# Patient Record
Sex: Male | Born: 1960 | Race: Black or African American | Hispanic: No | Marital: Married | Smoking: Current every day smoker
Health system: Southern US, Community
[De-identification: ages and names within clinical notes are randomized; demographics above are authoritative.]

## PROBLEM LIST (undated history)

## (undated) ENCOUNTER — Emergency Department (HOSPITAL_COMMUNITY): Discharge status: Absent without leave | Payer: Self-pay

## (undated) DIAGNOSIS — E119 Type 2 diabetes mellitus without complications: Secondary | ICD-10-CM

## (undated) DIAGNOSIS — E118 Type 2 diabetes mellitus with unspecified complications: Secondary | ICD-10-CM

## (undated) DIAGNOSIS — F17200 Nicotine dependence, unspecified, uncomplicated: Secondary | ICD-10-CM

## (undated) DIAGNOSIS — E785 Hyperlipidemia, unspecified: Secondary | ICD-10-CM

## (undated) HISTORY — DX: Nicotine dependence, unspecified, uncomplicated: F17.200

## (undated) HISTORY — DX: Type 2 diabetes mellitus with unspecified complications: E11.8

## (undated) HISTORY — DX: Hyperlipidemia, unspecified: E78.5

---

## 1998-05-16 ENCOUNTER — Emergency Department (HOSPITAL_COMMUNITY): Admission: EM | Admit: 1998-05-16 | Discharge: 1998-05-16 | Payer: Self-pay | Admitting: Internal Medicine

## 1999-12-21 ENCOUNTER — Emergency Department (HOSPITAL_COMMUNITY): Admission: EM | Admit: 1999-12-21 | Discharge: 1999-12-21 | Payer: Self-pay | Admitting: Emergency Medicine

## 2003-02-15 ENCOUNTER — Emergency Department (HOSPITAL_COMMUNITY): Admission: EM | Admit: 2003-02-15 | Discharge: 2003-02-15 | Payer: Self-pay | Admitting: *Deleted

## 2003-02-15 ENCOUNTER — Encounter: Payer: Self-pay | Admitting: *Deleted

## 2004-07-01 ENCOUNTER — Emergency Department (HOSPITAL_COMMUNITY): Admission: EM | Admit: 2004-07-01 | Discharge: 2004-07-01 | Payer: Self-pay | Admitting: Emergency Medicine

## 2007-07-14 ENCOUNTER — Emergency Department (HOSPITAL_COMMUNITY): Admission: EM | Admit: 2007-07-14 | Discharge: 2007-07-14 | Payer: Self-pay | Admitting: Emergency Medicine

## 2011-08-29 ENCOUNTER — Emergency Department (HOSPITAL_COMMUNITY): Payer: Self-pay

## 2011-08-29 ENCOUNTER — Emergency Department (HOSPITAL_COMMUNITY)
Admission: EM | Admit: 2011-08-29 | Discharge: 2011-08-29 | Disposition: A | Discharge status: Home / Self Care | Payer: Self-pay | Attending: Emergency Medicine | Admitting: Emergency Medicine

## 2011-08-29 DIAGNOSIS — R071 Chest pain on breathing: Secondary | ICD-10-CM | POA: Insufficient documentation

## 2011-08-29 DIAGNOSIS — F341 Dysthymic disorder: Secondary | ICD-10-CM | POA: Insufficient documentation

## 2011-08-29 LAB — COMPREHENSIVE METABOLIC PANEL
ALT: 23 U/L (ref 0–53)
Alkaline Phosphatase: 70 U/L (ref 39–117)
BUN: 15 mg/dL (ref 6–23)
CO2: 24 mEq/L (ref 19–32)
Calcium: 10.4 mg/dL (ref 8.4–10.5)
GFR calc Af Amer: >90 mL/min (ref >90)
GFR calc non Af Amer: >90 mL/min (ref >90)
Glucose, Bld: 192 mg/dL — ABNORMAL HIGH (ref 70–99)
Sodium: 137 mEq/L (ref 135–145)

## 2011-08-29 LAB — DIFFERENTIAL
Basophils Absolute: 0.1 10*3/uL (ref 0.0–0.1)
Lymphocytes Relative: 23 % (ref 12–46)
Lymphs Abs: 2.5 10*3/uL (ref 0.7–4.0)
Monocytes Absolute: 0.8 10*3/uL (ref 0.1–1.0)
Neutro Abs: 7.2 10*3/uL (ref 1.7–7.7)

## 2011-08-29 LAB — CBC
HCT: 40.9 % (ref 39.0–52.0)
Hemoglobin: 14.2 g/dL (ref 13.0–17.0)
MCH: 31.7 pg (ref 26.0–34.0)
RBC: 4.48 MIL/uL (ref 4.22–5.81)

## 2011-08-29 LAB — TROPONIN I: Troponin I: <0.3 ng/mL (ref <0.30)

## 2011-09-28 ENCOUNTER — Emergency Department (HOSPITAL_COMMUNITY)
Admission: EM | Admit: 2011-09-28 | Discharge: 2011-09-28 | Disposition: A | Discharge status: Home / Self Care | Payer: Self-pay | Attending: Emergency Medicine | Admitting: Emergency Medicine

## 2011-09-28 DIAGNOSIS — F17200 Nicotine dependence, unspecified, uncomplicated: Secondary | ICD-10-CM | POA: Insufficient documentation

## 2011-09-28 DIAGNOSIS — L732 Hidradenitis suppurativa: Secondary | ICD-10-CM | POA: Insufficient documentation

## 2011-09-28 DIAGNOSIS — IMO0002 Reserved for concepts with insufficient information to code with codable children: Secondary | ICD-10-CM | POA: Insufficient documentation

## 2011-09-28 MED ORDER — LIDOCAINE-EPINEPHRINE 2 %-1:100000 IJ SOLN
20.0000 mL | Freq: Once | INTRAMUSCULAR | Status: AC
  Administered 2011-09-28: 20 mL
  Filled 2011-09-28: qty 20

## 2011-09-28 NOTE — ED Provider Notes (Cosign Needed)
History     CSN: [id number] Arrival date & time: 09/28/2011  2:02 PM   First MD Initiated Contact with Patient 09/28/11 1902      Chief Complaint  Patient presents with  . Abscess    lt. armpit    (Consider location/radiation/quality/duration/timing/severity/associated sxs/prior treatment) The history is provided by the patient.   the patient is a 50 year old male, who complains of painful bumps under his left arm.  He denies trauma.  He denied nausea, vomiting, fevers, chills.  He has never had this before.  He thinks that they drained last night.  History reviewed. No pertinent past medical history.  History reviewed. No pertinent past surgical history.  History reviewed. No pertinent family history.  History  Substance Use Topics  . Smoking status: Current Everyday Smoker    Types: Cigarettes  . Smokeless tobacco: Never Used  . Alcohol Use: Yes      Review of Systems  Constitutional: Negative for fever and chills.  Gastrointestinal: Negative for nausea and vomiting.  Musculoskeletal:       Painful bumps in his left axilla  Skin: Negative for rash.  Neurological: Negative for headaches.  Psychiatric/Behavioral: Negative for confusion.    Allergies  Review of patient's allergies indicates no known allergies.  Home Medications  No current outpatient prescriptions on file.  BP 103/68  Pulse 81  Temp(Src) 97.7 F (36.5 C) (Oral)  Resp 16  SpO2 99%  Physical Exam  Constitutional: He is oriented to person, place, and time. He appears well-developed and well-nourished.  HENT:  Head: Normocephalic and atraumatic.  Eyes: Pupils are equal, round, and reactive to light.  Neck: Normal range of motion.  Abdominal: He exhibits no distension.  Musculoskeletal:       3 cm abscess and 2 cm abscess in left axilla, with moderate amount of tenderness.  Neurological: He is alert and oriented to person, place, and time.  Skin: Skin is warm and dry. No rash noted.    Psychiatric: He has a normal mood and affect.    ED Course  INCISION AND DRAINAGE Date/Time: 09/28/2011 7:48 PM Performed by: Weldon Inches, Kevork Joyce P Authorized by: Weldon Inches, Fatoumata Albaugh P Consent: Verbal consent obtained. Consent given by: patient Patient understanding: patient states understanding of the procedure being performed Type: abscess Body area: upper extremity (Left axilla) Anesthesia: local infiltration Local anesthetic: lidocaine 2% with epinephrine Anesthetic total: 3 ml Patient sedated: no Scalpel size: 11 Needle gauge: 20 Incision type: single straight Complexity: simple Drainage: purulent Drainage amount: moderate Packing material: 1/4 in iodoform gauze Patient tolerance: Patient tolerated the procedure well with no immediate complications.   (including critical care time)   Incision and drainage of 2 abscesses in the left axilla  Labs Reviewed - No data to display No results found.   No diagnosis found.    MDM  Hidradenitis        Nicholes Stairs, MD 09/28/11 1950

## 2011-09-28 NOTE — ED Notes (Signed)
3 abcesses under lt. Arm pit, drained  Slightly yesterday, very painful

## 2011-09-28 NOTE — ED Notes (Signed)
No answer when called to come to room from lobby.

## 2011-11-08 ENCOUNTER — Emergency Department (HOSPITAL_COMMUNITY)
Admission: EM | Admit: 2011-11-08 | Discharge: 2011-11-09 | Disposition: A | Discharge status: Home / Self Care | Payer: Self-pay | Attending: Emergency Medicine | Admitting: Emergency Medicine

## 2011-11-08 ENCOUNTER — Emergency Department (HOSPITAL_COMMUNITY): Payer: Self-pay

## 2011-11-08 ENCOUNTER — Encounter (HOSPITAL_COMMUNITY): Payer: Self-pay | Admitting: Adult Health

## 2011-11-08 DIAGNOSIS — F17200 Nicotine dependence, unspecified, uncomplicated: Secondary | ICD-10-CM | POA: Insufficient documentation

## 2011-11-08 DIAGNOSIS — IMO0001 Reserved for inherently not codable concepts without codable children: Secondary | ICD-10-CM | POA: Insufficient documentation

## 2011-11-08 DIAGNOSIS — J189 Pneumonia, unspecified organism: Secondary | ICD-10-CM | POA: Insufficient documentation

## 2011-11-08 MED ORDER — ALBUTEROL SULFATE HFA 108 (90 BASE) MCG/ACT IN AERS
2.0000 | INHALATION_SPRAY | RESPIRATORY_TRACT | Status: DC | PRN
  Administered 2011-11-09: 2 via RESPIRATORY_TRACT
  Filled 2011-11-08: qty 6.7

## 2011-11-08 NOTE — ED Notes (Signed)
Reports cough, chills, fever and loss of voice since Tuesday coughis productive yellow sputum.

## 2011-11-08 NOTE — ED Provider Notes (Signed)
History     CSN: [id number]  Arrival date & time 11/08/11  1719   First MD Initiated Contact with Patient 11/08/11 2124      Chief Complaint  Patient presents with  . Influenza    (Consider location/radiation/quality/duration/timing/severity/associated sxs/prior treatment) Patient is a 51 y.o. male presenting with flu symptoms.  Influenza This is a new problem. The current episode started in the past 7 days. The problem occurs constantly. The problem has been unchanged. Associated symptoms include chills, congestion, coughing, fatigue, a fever, headaches, myalgias and a sore throat. Pertinent negatives include no abdominal pain, change in bowel habit, chest pain, joint swelling, nausea, neck pain, numbness, rash, swollen glands, urinary symptoms, vertigo, visual change, vomiting or weakness. The symptoms are aggravated by nothing. Treatments tried: otc meds. The treatment provided mild relief.    History reviewed. No pertinent past medical history.  History reviewed. No pertinent past surgical history.  History reviewed. No pertinent family history.  History  Substance Use Topics  . Smoking status: Current Everyday Smoker    Types: Cigarettes  . Smokeless tobacco: Never Used  . Alcohol Use: Yes      Review of Systems  Constitutional: Positive for fever, chills and fatigue.  HENT: Positive for congestion, sore throat and sinus pressure. Negative for hearing loss, ear pain, drooling, trouble swallowing, neck pain, neck stiffness, voice change, tinnitus and ear discharge.   Eyes: Negative for pain and visual disturbance.  Respiratory: Positive for cough. Negative for chest tightness and shortness of breath.   Cardiovascular: Negative for chest pain.  Gastrointestinal: Negative for nausea, vomiting, abdominal pain and change in bowel habit.  Genitourinary: Negative for dysuria, hematuria and flank pain.  Musculoskeletal: Positive for myalgias. Negative for back pain, joint  swelling and gait problem.  Skin: Negative for rash.  Neurological: Positive for headaches. Negative for dizziness, vertigo, syncope, weakness and numbness.  Psychiatric/Behavioral: Negative for confusion.    Allergies  Review of patient's allergies indicates no known allergies.  Home Medications   Current Outpatient Rx  Name Route Sig Dispense Refill  . IBUPROFEN 200 MG PO TABS Oral Take 400 mg by mouth every 6 (six) hours as needed. headache    . PHENYLEPHRINE-PHENIRAMINE-DM 08-15-19 MG PO PACK Oral Take 1 packet by mouth 2 (two) times daily as needed.      BP 111/71  Pulse 90  Temp 98.9 F (37.2 C)  Resp 20  SpO2 95%  Physical Exam  Nursing note and vitals reviewed. Constitutional: He is oriented to person, place, and time. He appears well-developed and well-nourished.       Uncomfortable appearing  HENT:  Head: Normocephalic and atraumatic.  Right Ear: External ear normal.  Left Ear: External ear normal.  Nose: Nose normal.  Mouth/Throat: Oropharyngeal exudate present.       Bilateral TM with mild erythema.   Eyes: Conjunctivae are normal. Pupils are equal, round, and reactive to light. Right eye exhibits no discharge. Left eye exhibits no discharge.  Neck: Normal range of motion. Neck supple.  Cardiovascular: Normal rate and regular rhythm.   No murmur heard. Pulmonary/Chest: Effort normal and breath sounds normal. No respiratory distress. He has no wheezes. He exhibits no tenderness.  Abdominal: Soft. He exhibits no distension. There is no tenderness.  Musculoskeletal: He exhibits no edema and no tenderness.  Lymphadenopathy:    He has no cervical adenopathy.  Neurological: He is alert and oriented to person, place, and time. No cranial nerve deficit.  Skin: Skin is  warm and dry. No rash noted.    ED Course  Procedures (including critical care time)   Labs Reviewed  RAPID STREP SCREEN   Dg Chest 2 View  11/08/2011  *RADIOLOGY REPORT*  Clinical Data: Mild  hypoxia; cough and shortness of breath. Congestion and fever.  History of smoking.  CHEST - 2 VIEW  Comparison: Chest radiograph performed 08/29/2011  Findings: The lungs are well-aerated.  Patchy bilateral midlung airspace opacity is noted, worse on the left, concerning for multifocal pneumonia.  There is no evidence of pleural effusion or pneumothorax.  The heart is normal in size; the mediastinal contour is within normal limits.  No acute osseous abnormalities are seen.  IMPRESSION: Bilateral midlung airspace disease, worse on the left, concerning for multifocal pneumonia.  Original Report Authenticated By: Tonia Ghent, M.D.     Dx 1: CAP   MDM  Oropharyngeal exudate swabbed for strep. CXR ordered to r/o pneumonia given mild hypoxia at 95% on RA      12:00 AM Pneumonia on CXR. Given multifocal description of airspace disease, will tx with avelox. Also ordered albuterol MDI for cough and airway constriction.  [address] [place], [place] 11/09/11 [phone]

## 2011-11-09 MED ORDER — MOXIFLOXACIN HCL 400 MG PO TABS: 400.0000 mg | ORAL_TABLET | Freq: Every day | ORAL | Status: AC

## 2011-11-09 NOTE — ED Notes (Signed)
D/C completed by A.Dennis,RN 

## 2011-11-09 NOTE — ED Provider Notes (Signed)
Medical screening examination/treatment/procedure(s) were performed by non-physician practitioner and as supervising physician I was immediately available for consultation/collaboration.   Erikka Follmer M Krizia Flight, DO 11/09/11 1259 

## 2012-02-08 IMAGING — CR DG CHEST 2V
2 series · 2 of 2 positions shown · non-contrast
Comparison: Chest radiograph performed 08/29/2011

CLINICAL DATA: Mild hypoxia; cough and shortness of breath.
Congestion and fever.  History of smoking.

CHEST - 2 VIEW

[w chest pa]
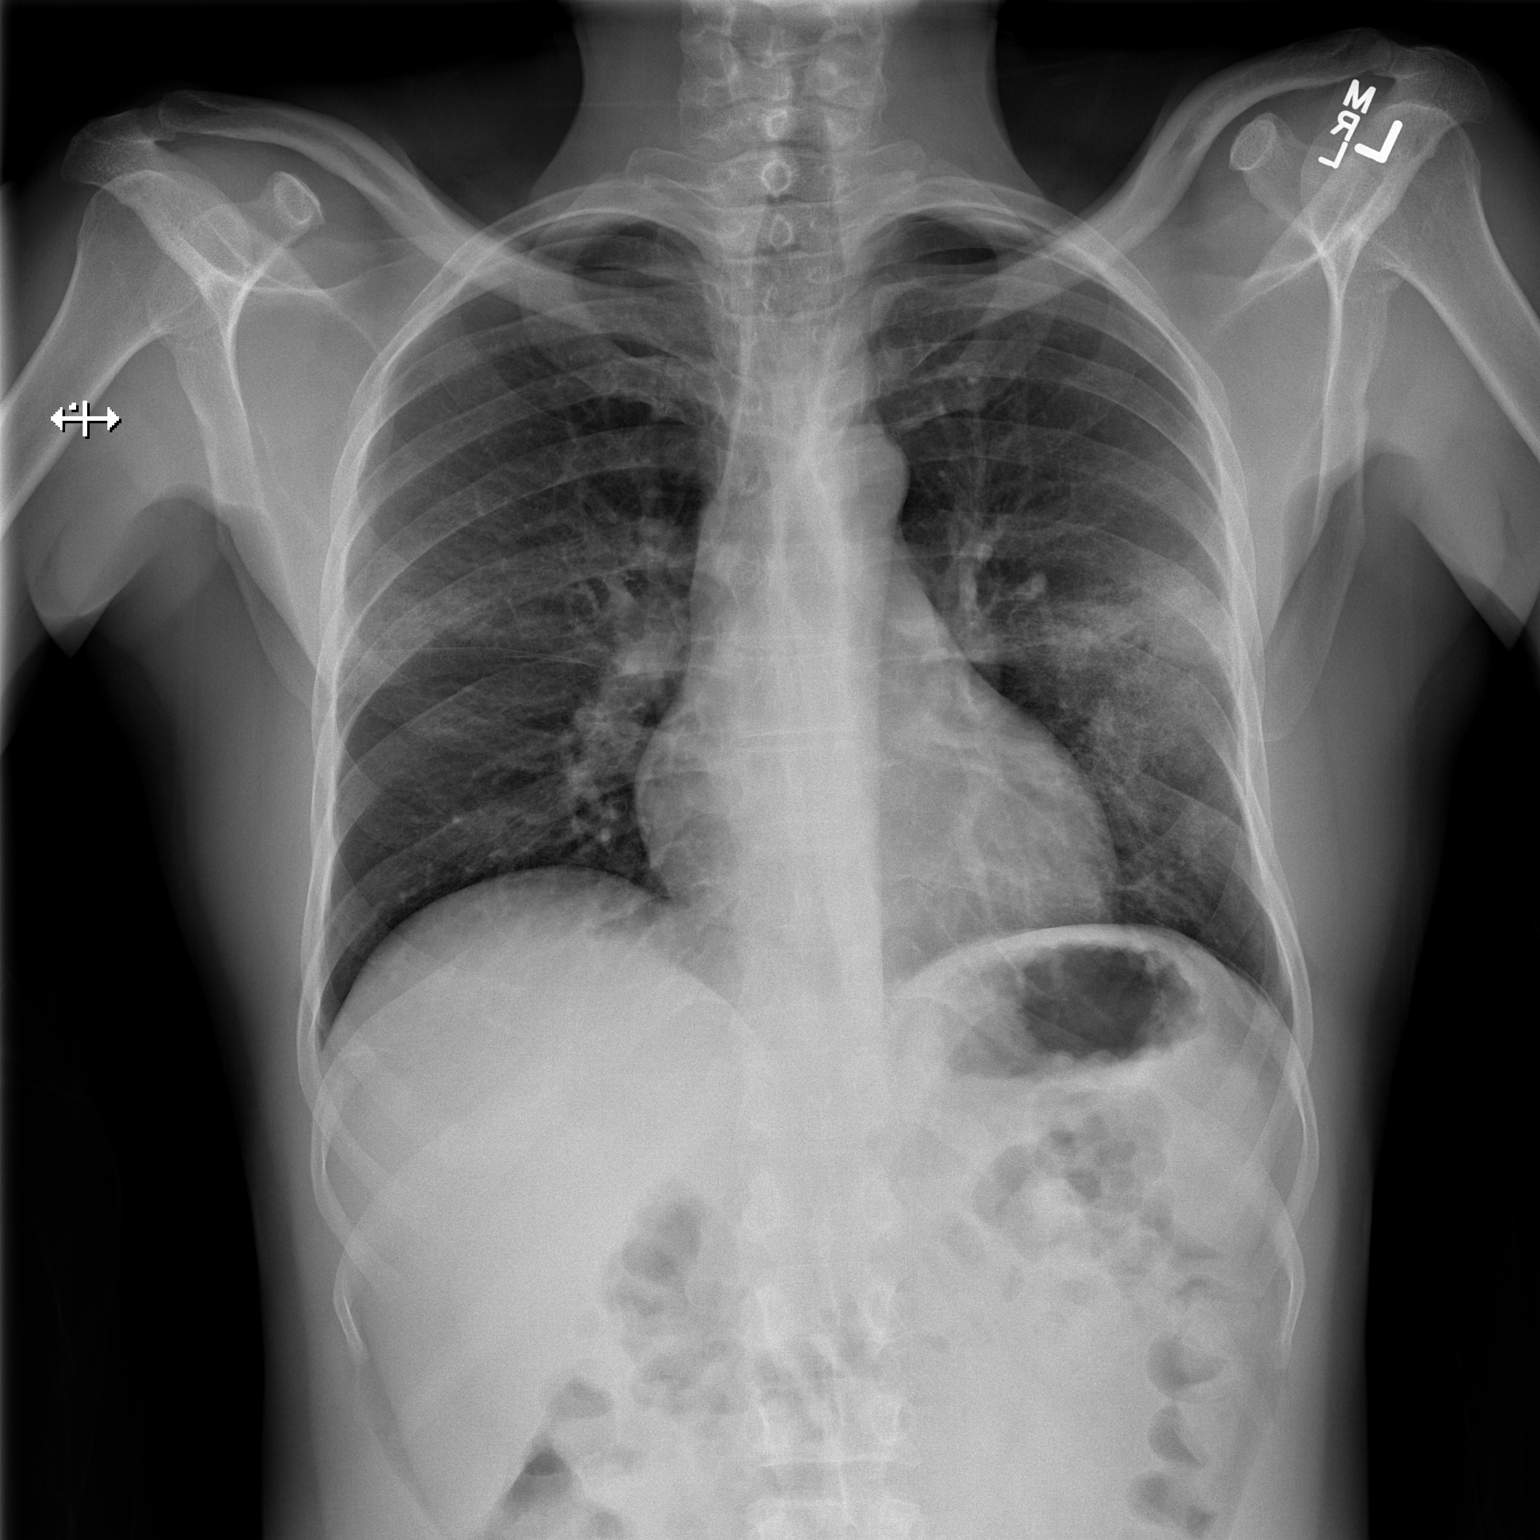

[w chest lat]
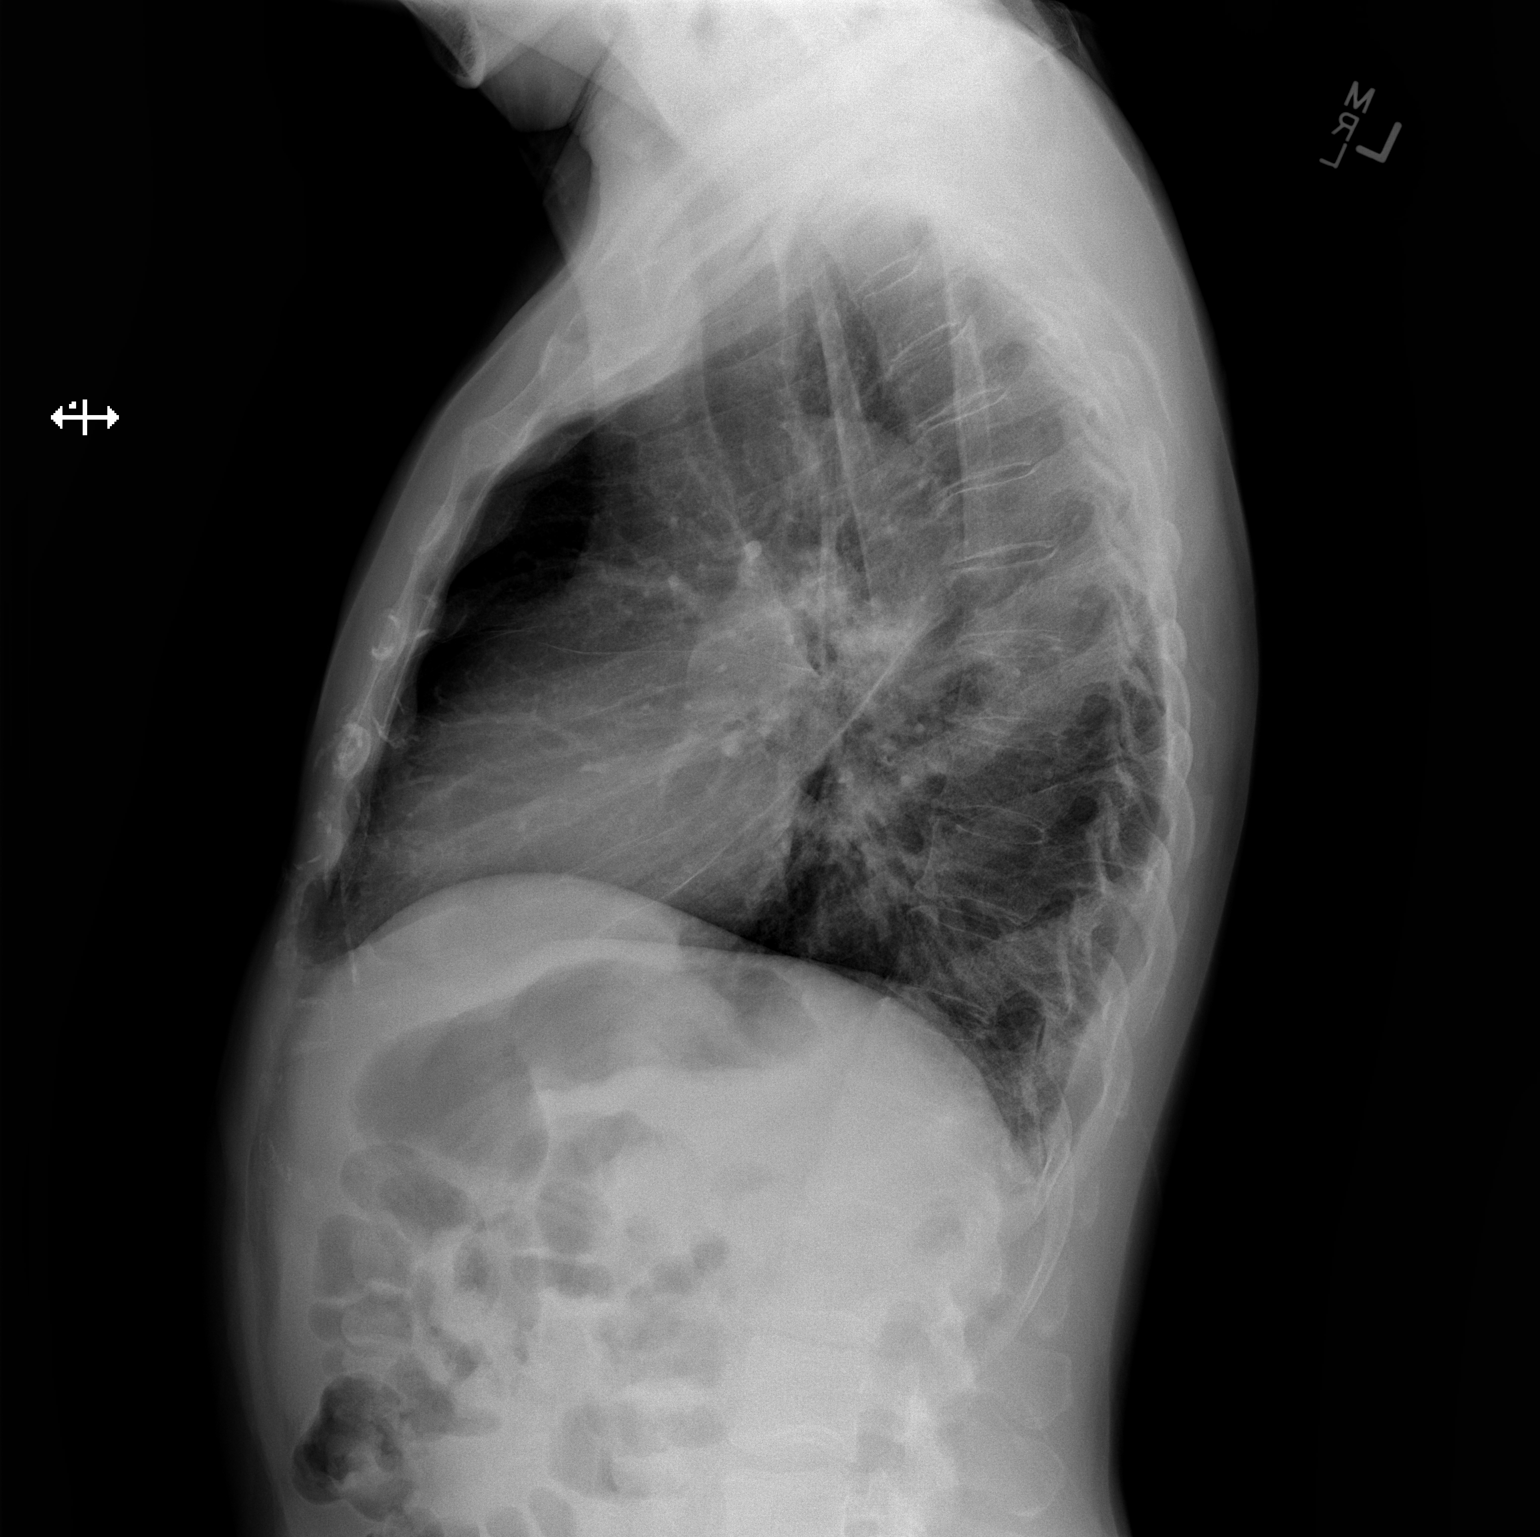

[2 of 2 positions shown; findings below may reference images not displayed]

FINDINGS: The lungs are well-aerated.  Patchy bilateral midlung
airspace opacity is noted, worse on the left, concerning for
multifocal pneumonia.  There is no evidence of pleural effusion or
pneumothorax.

The heart is normal in size; the mediastinal contour is within
normal limits.  No acute osseous abnormalities are seen.
IMPRESSION: Bilateral midlung airspace disease, worse on the left, concerning
for multifocal pneumonia.

## 2012-12-30 ENCOUNTER — Ambulatory Visit: Payer: Self-pay | Admitting: Endocrinology

## 2013-01-13 ENCOUNTER — Ambulatory Visit: Payer: Self-pay | Admitting: Endocrinology

## 2013-01-13 DIAGNOSIS — Z0289 Encounter for other administrative examinations: Secondary | ICD-10-CM

## 2014-07-08 ENCOUNTER — Emergency Department (HOSPITAL_COMMUNITY)
Admission: EM | Admit: 2014-07-08 | Discharge: 2014-07-09 | Disposition: A | Discharge status: Home / Self Care | Payer: Self-pay | Attending: Emergency Medicine | Admitting: Emergency Medicine

## 2014-07-08 ENCOUNTER — Encounter (HOSPITAL_COMMUNITY): Payer: Self-pay | Admitting: Emergency Medicine

## 2014-07-08 DIAGNOSIS — Z23 Encounter for immunization: Secondary | ICD-10-CM | POA: Insufficient documentation

## 2014-07-08 DIAGNOSIS — T148XXA Other injury of unspecified body region, initial encounter: Secondary | ICD-10-CM

## 2014-07-08 DIAGNOSIS — F17200 Nicotine dependence, unspecified, uncomplicated: Secondary | ICD-10-CM | POA: Insufficient documentation

## 2014-07-08 DIAGNOSIS — E119 Type 2 diabetes mellitus without complications: Secondary | ICD-10-CM | POA: Insufficient documentation

## 2014-07-08 DIAGNOSIS — IMO0002 Reserved for concepts with insufficient information to code with codable children: Secondary | ICD-10-CM | POA: Insufficient documentation

## 2014-07-08 DIAGNOSIS — F1010 Alcohol abuse, uncomplicated: Secondary | ICD-10-CM | POA: Insufficient documentation

## 2014-07-08 DIAGNOSIS — R739 Hyperglycemia, unspecified: Secondary | ICD-10-CM

## 2014-07-08 HISTORY — DX: Type 2 diabetes mellitus without complications: E11.9

## 2014-07-08 LAB — CBG MONITORING, ED: Glucose-Capillary: 460 mg/dL — ABNORMAL HIGH (ref 70–99)
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
## 2014-07-09 LAB — RAPID URINE DRUG SCREEN, HOSP PERFORMED
AMPHETAMINES: NOT DETECTED
BARBITURATES: NOT DETECTED
BENZODIAZEPINES: NOT DETECTED
COCAINE: NOT DETECTED
Opiates: NOT DETECTED
TETRAHYDROCANNABINOL: NOT DETECTED

## 2014-07-09 LAB — I-STAT CHEM 8, ED
BUN: 15 mg/dL (ref 6–23)
CHLORIDE: 107 meq/L (ref 96–112)
CREATININE: 0.8 mg/dL (ref 0.50–1.35)
Calcium, Ion: 1.26 mmol/L — ABNORMAL HIGH (ref 1.12–1.23)
Glucose, Bld: 183 mg/dL — ABNORMAL HIGH (ref 70–99)
HCT: 37 % — ABNORMAL LOW (ref 39.0–52.0)
HEMOGLOBIN: 12.6 g/dL — AB (ref 13.0–17.0)
POTASSIUM: 3.9 meq/L (ref 3.7–5.3)
SODIUM: 140 meq/L (ref 137–147)
TCO2: 21 mmol/L (ref 0–100)

## 2014-07-09 LAB — COMPREHENSIVE METABOLIC PANEL
ALBUMIN: 4.6 g/dL (ref 3.5–5.2)
ALK PHOS: 87 U/L (ref 39–117)
ALT: 17 U/L (ref 0–53)
AST: 13 U/L (ref 0–37)
Anion gap: 23 — ABNORMAL HIGH (ref 5–15)
BUN: 18 mg/dL (ref 6–23)
CHLORIDE: 98 meq/L (ref 96–112)
CO2: 17 meq/L — AB (ref 19–32)
CREATININE: 0.97 mg/dL (ref 0.50–1.35)
Calcium: 10.7 mg/dL — ABNORMAL HIGH (ref 8.4–10.5)
GFR calc Af Amer: >90 mL/min (ref >90)
Glucose, Bld: 465 mg/dL — ABNORMAL HIGH (ref 70–99)
POTASSIUM: 3.9 meq/L (ref 3.7–5.3)
SODIUM: 138 meq/L (ref 137–147)
Total Protein: 8.3 g/dL (ref 6.0–8.3)

## 2014-07-09 LAB — CBG MONITORING, ED: GLUCOSE-CAPILLARY: 117 mg/dL — AB (ref 70–99)

## 2014-07-09 LAB — CBC
HCT: 43 % (ref 39.0–52.0)
Hemoglobin: 14.8 g/dL (ref 13.0–17.0)
MCH: 31.2 pg (ref 26.0–34.0)
MCHC: 34.4 g/dL (ref 30.0–36.0)
MCV: 90.5 fL (ref 78.0–100.0)
PLATELETS: 274 10*3/uL (ref 150–400)
RBC: 4.75 MIL/uL (ref 4.22–5.81)
RDW: 12.8 % (ref 11.5–15.5)
WBC: 12.5 10*3/uL — AB (ref 4.0–10.5)

## 2014-07-09 LAB — SALICYLATE LEVEL: Salicylate Lvl: <2 mg/dL — ABNORMAL LOW (ref 2.8–20.0)

## 2014-07-09 LAB — ACETAMINOPHEN LEVEL

## 2014-07-09 LAB — ETHANOL: Alcohol, Ethyl (B): 119 mg/dL — ABNORMAL HIGH (ref 0–11)

## 2014-07-09 MED ORDER — INSULIN ASPART 100 UNIT/ML IV SOLN
10.0000 [IU] | Freq: Once | INTRAVENOUS | Status: AC
  Administered 2014-07-09: 10 [IU] via INTRAVENOUS
  Filled 2014-07-09: qty 0.1

## 2014-07-09 MED ORDER — SODIUM CHLORIDE 0.9 % IV BOLUS (SEPSIS)
1000.0000 mL | Freq: Once | INTRAVENOUS | Status: AC
  Administered 2014-07-09: 1000 mL via INTRAVENOUS

## 2014-07-09 MED ORDER — TETANUS-DIPHTH-ACELL PERTUSSIS 5-2.5-18.5 LF-MCG/0.5 IM SUSP
0.5000 mL | Freq: Once | INTRAMUSCULAR | Status: AC
  Administered 2014-07-09: 0.5 mL via INTRAMUSCULAR
  Filled 2014-07-09: qty 0.5

## 2014-07-09 MED ORDER — POTASSIUM CHLORIDE CRYS ER 20 MEQ PO TBCR
40.0000 meq | EXTENDED_RELEASE_TABLET | Freq: Once | ORAL | Status: AC
  Administered 2014-07-09: 40 meq via ORAL
  Filled 2014-07-09: qty 2
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
## 2014-07-09 NOTE — ED Notes (Signed)
Patient

## 2014-07-09 NOTE — ED Notes (Signed)
Patient is alert and oriented x3.  He was given DC instructions and follow up visit instructions.  Patient gave verbal understanding.  He was DC ambulatory under his own power to home.  V/S stable.  He was not showing any signs of distress on DC 
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
## 2016-10-17 ENCOUNTER — Encounter (HOSPITAL_COMMUNITY): Payer: Self-pay | Admitting: Emergency Medicine

## 2016-10-17 ENCOUNTER — Emergency Department (HOSPITAL_COMMUNITY)
Admission: EM | Admit: 2016-10-17 | Discharge: 2016-10-17 | Disposition: A | Discharge status: Home / Self Care | Payer: Self-pay | Attending: Emergency Medicine | Admitting: Emergency Medicine

## 2016-10-17 DIAGNOSIS — F17210 Nicotine dependence, cigarettes, uncomplicated: Secondary | ICD-10-CM | POA: Insufficient documentation

## 2016-10-17 DIAGNOSIS — B9789 Other viral agents as the cause of diseases classified elsewhere: Secondary | ICD-10-CM

## 2016-10-17 DIAGNOSIS — E119 Type 2 diabetes mellitus without complications: Secondary | ICD-10-CM | POA: Insufficient documentation

## 2016-10-17 DIAGNOSIS — J069 Acute upper respiratory infection, unspecified: Secondary | ICD-10-CM | POA: Insufficient documentation

## 2016-10-17 MED ORDER — BENZONATATE 100 MG PO CAPS
200.0000 mg | ORAL_CAPSULE | Freq: Two times a day (BID) | ORAL | 0 refills | Status: DC | PRN
Start: 2016-10-17 — End: 2016-11-13

## 2016-10-17 MED ORDER — CETIRIZINE HCL 10 MG PO TABS: 10.0000 mg | ORAL_TABLET | Freq: Every day | ORAL | 1 refills | Status: DC

## 2016-10-17 NOTE — ED Triage Notes (Signed)
Pt c/o chest congestion, coughing, since Monday, some nasal congestion as well. Had some chills the other night.

## 2016-10-17 NOTE — ED Notes (Signed)
Declined W/C at D/C and was escorted to lobby by RN. 

## 2016-10-17 NOTE — ED Provider Notes (Signed)
[facility]EM[facility]Y DEPT Provider Note   CSN: [id number] Arrival date & time: 10/17/16  1503   By signing my name below, I, Nelwyn SalisburyJoshua Fowler, attest that this documentation has been prepared under the direction and in the presence of non-physician practitioner, Roxy Horsemanobert Jamariya Davidoff, PA-C. Electronically Signed: Nelwyn SalisburyJoshua Fowler, Scribe. 10/17/2016. 5:23 PM.   History   Chief Complaint Chief Complaint  Patient presents with  . Cough   The history is provided by the patient. No language interpreter was used.    Gregory Schaefer is a 55 y.o. male with pmhx of DM who presents to the Emergency Department with a chief complaint of gradual onset intermittent worsening cough beginning about 5 days ago. He has tried at home Nyquil, Exedrin, Theraflu and hot tea with no relief of symptoms. He reports associated nasal congestion and chills. Pt denies any other symptoms.   Past Medical History:  Diagnosis Date  . Diabetes mellitus without complication (HCC)     There are no active problems to display for this patient.   History reviewed. No pertinent surgical history.     Home Medications    Prior to Admission medications   Not on File    Family History No family history on file.  Social History Social History  Substance Use Topics  . Smoking status: Current Every Day Smoker    Types: Cigarettes  . Smokeless tobacco: Never Used  . Alcohol use Yes     Allergies   Patient has no known allergies.   Review of Systems Review of Systems  Constitutional: Positive for chills. Negative for fever.  HENT: Positive for congestion, postnasal drip, rhinorrhea, sinus pressure and sneezing. Negative for sore throat.   Respiratory: Positive for cough. Negative for shortness of breath.   Cardiovascular: Negative for chest pain.  Gastrointestinal: Negative for abdominal pain, constipation, diarrhea, nausea and vomiting.  Genitourinary: Negative for dysuria.     Physical Exam Updated Vital Signs BP  113/78   Pulse 95   Temp 98.7 F (37.1 C)   Resp 16   SpO2 96%   Physical Exam Physical Exam  Constitutional: Pt  is oriented to person, place, and time. Appears well-developed and well-nourished. No distress.  HENT:  Head: Normocephalic and atraumatic.  Right Ear: Tympanic membrane, external ear and ear canal normal.  Left Ear: Tympanic membrane, external ear and ear canal normal.  Nose: Mucosal edema and mild rhinorrhea present. No epistaxis. Right sinus exhibits no maxillary sinus tenderness and no frontal sinus tenderness. Left sinus exhibits no maxillary sinus tenderness and no frontal sinus tenderness.  Mouth/Throat: Uvula is midline and mucous membranes are normal. Mucous membranes are not pale and not cyanotic. No oropharyngeal exudate, posterior oropharyngeal edema, posterior oropharyngeal erythema or tonsillar abscesses.  Eyes: Conjunctivae are normal. Pupils are equal, round, and reactive to light.  Neck: Normal range of motion and full passive range of motion without pain.  Cardiovascular: Normal rate and intact distal pulses.   Pulmonary/Chest: Effort normal and breath sounds normal. No stridor.  Clear and equal breath sounds without focal wheezes, rhonchi, rales  Abdominal: Soft. Bowel sounds are normal. There is no tenderness.  Musculoskeletal: Normal range of motion.  Lymphadenopathy:    Pthas no cervical adenopathy.  Neurological: Pt is alert and oriented to person, place, and time.  Skin: Skin is warm and dry. No rash noted. Pt is not diaphoretic.  Psychiatric: Normal mood and affect.  Nursing note and vitals reviewed.    ED Treatments / Results  DIAGNOSTIC  STUDIES:  Oxygen Saturation is 96% on RA, adequate by my interpretation.    COORDINATION OF CARE:  5:48 PM Discussed treatment plan with pt at bedside which includes use of OTC Claritin, cough syrup and Zyrtec and pt agreed to plan.  Labs (all labs ordered are listed, but only abnormal results are  displayed) Labs Reviewed - No data to display  EKG  EKG Interpretation None       Radiology No results found.  Procedures Procedures (including critical care time)  Medications Ordered in ED Medications - No data to display   Initial Impression / Assessment and Plan / ED Course  I have reviewed the triage vital signs and the nursing notes.  Pertinent labs & imaging results that were available during my care of the patient were reviewed by me and considered in my medical decision making (see chart for details).  Clinical Course      Pt symptoms consistent with URI.  Pt will be discharged with symptomatic treatment.  Discussed return precautions.  Pt is hemodynamically stable & in NAD prior to discharge.  Final Clinical Impressions(s) / ED Diagnoses   Final diagnoses:  Viral URI with cough    New Prescriptions New Prescriptions   BENZONATATE (TESSALON) 100 MG CAPSULE    Take 2 capsules (200 mg total) by mouth 2 (two) times daily as needed for cough.   CETIRIZINE (ZYRTEC ALLERGY) 10 MG TABLET    Take 1 tablet (10 mg total) by mouth daily.   I personally performed the services described in this documentation, which was scribed in my presence. The recorded information has been reviewed and is accurate.       Roxy HorsemanRobert Estanislado Surgeon, PA-C 10/17/16 1807    Nira ConnPedro Eduardo Cardama, MD 10/18/16 [phone]

## 2016-11-07 ENCOUNTER — Emergency Department (HOSPITAL_COMMUNITY): Payer: Self-pay

## 2016-11-07 ENCOUNTER — Emergency Department (HOSPITAL_COMMUNITY)
Admission: EM | Admit: 2016-11-07 | Discharge: 2016-11-08 | Disposition: A | Discharge status: Home / Self Care | Payer: Self-pay | Attending: Emergency Medicine | Admitting: Emergency Medicine

## 2016-11-07 ENCOUNTER — Encounter (HOSPITAL_COMMUNITY): Payer: Self-pay

## 2016-11-07 DIAGNOSIS — K219 Gastro-esophageal reflux disease without esophagitis: Secondary | ICD-10-CM | POA: Insufficient documentation

## 2016-11-07 DIAGNOSIS — B9789 Other viral agents as the cause of diseases classified elsewhere: Secondary | ICD-10-CM

## 2016-11-07 DIAGNOSIS — R05 Cough: Secondary | ICD-10-CM

## 2016-11-07 DIAGNOSIS — R059 Cough, unspecified: Secondary | ICD-10-CM

## 2016-11-07 DIAGNOSIS — R739 Hyperglycemia, unspecified: Secondary | ICD-10-CM

## 2016-11-07 DIAGNOSIS — F17210 Nicotine dependence, cigarettes, uncomplicated: Secondary | ICD-10-CM | POA: Insufficient documentation

## 2016-11-07 DIAGNOSIS — J069 Acute upper respiratory infection, unspecified: Secondary | ICD-10-CM | POA: Insufficient documentation

## 2016-11-07 DIAGNOSIS — E1165 Type 2 diabetes mellitus with hyperglycemia: Secondary | ICD-10-CM | POA: Insufficient documentation

## 2016-11-07 DIAGNOSIS — Z79899 Other long term (current) drug therapy: Secondary | ICD-10-CM | POA: Insufficient documentation

## 2016-11-07 LAB — CBG MONITORING, ED: GLUCOSE-CAPILLARY: 384 mg/dL — AB (ref 65–99)

## 2016-11-07 MED ORDER — SODIUM CHLORIDE 0.9 % IV BOLUS (SEPSIS)
1000.0000 mL | Freq: Once | INTRAVENOUS | Status: AC
  Administered 2016-11-08: 1000 mL via INTRAVENOUS

## 2016-11-07 NOTE — ED Triage Notes (Signed)
Patient here with 3 weeks of cough that is productive and worse at night. States that his BS has been running high the past 2 weeks with same, denies fever. Alert and oriented, NAD. Taking tessalon pearls with no relief

## 2016-11-07 NOTE — ED Provider Notes (Signed)
[facility] Provider Note   CSN: [id number] Arrival date & time: 11/07/16  1807     History   Chief Complaint Chief Complaint  Patient presents with  . Cough    HPI Gregory Schaefer is a 56 y.o. male who presents for cc of cough . The patient has a hx of DM and is currently uninsured. Patient is not taking any medications for his sugar at this time. The has had a persistent cough for the past 4 weeks. It started with a uri. He states that the cough is worse at night, but also worse with reflux. He denies asthma. He is a current daily smoker. He denies fevers or chills. He has taken Tessalon without relief of his sxs.  HPI  Past Medical History:  Diagnosis Date  . Diabetes mellitus without complication (HCC)     There are no active problems to display for this patient.   History reviewed. No pertinent surgical history.     Home Medications    Prior to Admission medications   Medication Sig Start Date End Date Taking? Authorizing Provider  benzonatate (TESSALON) 100 MG capsule Take 2 capsules (200 mg total) by mouth 2 (two) times daily as needed for cough. 10/17/16   Roxy Horseman, PA-C  cetirizine (ZYRTEC ALLERGY) 10 MG tablet Take 1 tablet (10 mg total) by mouth daily. 10/17/16   Roxy Horseman, PA-C    Family History No family history on file.  Social History Social History  Substance Use Topics  . Smoking status: Current Every Day Smoker    Types: Cigarettes  . Smokeless tobacco: Never Used  . Alcohol use Yes     Allergies   Patient has no known allergies.   Review of Systems Review of Systems Ten systems reviewed and are negative for acute change, except as noted in the HPI.    Physical Exam Updated Vital Signs BP 118/93 (BP Location: Left Arm)   Pulse 92   Temp 98.8 F (37.1 C) (Oral)   Resp 18   SpO2 99%   Physical Exam  Constitutional: He appears well-developed and well-nourished. No distress.  HENT:  Head: Normocephalic and  atraumatic.  Eyes: Conjunctivae are normal. No scleral icterus.  Neck: Normal range of motion. Neck supple.  Cardiovascular: Normal rate, regular rhythm and normal heart sounds.   Pulmonary/Chest: Effort normal and breath sounds normal. No respiratory distress. He has no wheezes.  Abdominal: Soft. There is no tenderness.  Musculoskeletal: He exhibits no edema.  Neurological: He is alert.  Skin: Skin is warm and dry. He is not diaphoretic.  Psychiatric: His behavior is normal.  Nursing note and vitals reviewed.    ED Treatments / Results  Labs (all labs ordered are listed, but only abnormal results are displayed) Labs Reviewed  CBG MONITORING, ED - Abnormal; Notable for the following:       Result Value   Glucose-Capillary 384 (*)    All other components within normal limits  BASIC METABOLIC PANEL  CBC    EKG  EKG Interpretation None       Radiology Dg Chest 2 View  Result Date: 11/07/2016 CLINICAL DATA:  Cough. EXAM: CHEST  2 VIEW COMPARISON:  11/08/2011 FINDINGS: The lungs are clear wiithout focal pneumonia, edema, pneumothorax or pleural effusion. Interstitial markings are diffusely coarsened with chronic features. The cardiopericardial silhouette is within normal limits for size. The visualized bony structures of the thorax are intact. IMPRESSION: Mid lung airspace disease seen previously has resolved in  the interval. No acute cardiopulmonary findings. Electronically Signed   By: Kennith CenterEric  Mansell M.D.   On: 11/07/2016 19:28    Procedures Procedures (including critical care time)  Medications Ordered in ED Medications - No data to display   Initial Impression / Assessment and Plan / ED Course  I have reviewed the triage vital signs and the nursing notes.  Pertinent labs & imaging results that were available during my care of the patient were reviewed by me and considered in my medical decision making (see chart for details).  Clinical Course    Patient with  hyperglycemia. Labs are pending. Patient has been given a bolus of fluid. I gave her a report to Dr. Adela LankFloyd, who will assume care. I have also contacted case management about providing the patient with long-term care for his diabetes. Discussed return precautions.    Final Clinical Impressions(s) / ED Diagnoses   Final diagnoses:  Viral URI with cough  Gastroesophageal reflux disease, esophagitis presence not specified  Hyperglycemia  Cough    New Prescriptions New Prescriptions   AZITHROMYCIN (ZITHROMAX Z-PAK) 250 MG TABLET    2 po day one, then 1 daily x 4 days   DEXTROMETHORPHAN-GUAIFENESIN 15-400 MG TABS    Take 1 tablet by mouth every 4 (four) hours as needed. For cough   ESOMEPRAZOLE (NEXIUM) 20 MG CAPSULE    Take 1 capsule (20 mg total) by mouth daily.   METFORMIN (GLUCOPHAGE) 500 MG TABLET    Take 1 tablet (500 mg total) by mouth 2 (two) times daily with a meal.     Arthor CaptainAbigail Mushka Laconte, PA-C 11/08/16 0037    Margarita Grizzleanielle Ray, MD 11/08/16 2342

## 2016-11-07 NOTE — ED Notes (Signed)
Patient ambulated independently to the room.  No distress noted.

## 2016-11-08 LAB — CBC
HCT: 42.1 % (ref 39.0–52.0)
HEMOGLOBIN: 14.4 g/dL (ref 13.0–17.0)
MCH: 30 pg (ref 26.0–34.0)
MCHC: 34.2 g/dL (ref 30.0–36.0)
MCV: 87.7 fL (ref 78.0–100.0)
PLATELETS: 301 10*3/uL (ref 150–400)
RBC: 4.8 MIL/uL (ref 4.22–5.81)
RDW: 12.9 % (ref 11.5–15.5)
WBC: 7.2 10*3/uL (ref 4.0–10.5)

## 2016-11-08 LAB — BASIC METABOLIC PANEL
Anion gap: 11 (ref 5–15)
BUN: 8 mg/dL (ref 6–20)
CALCIUM: 9.5 mg/dL (ref 8.9–10.3)
CO2: 22 mmol/L (ref 22–32)
CREATININE: 0.82 mg/dL (ref 0.61–1.24)
Chloride: 104 mmol/L (ref 101–111)
GFR calc Af Amer: >60 mL/min (ref >60)
GLUCOSE: 267 mg/dL — AB (ref 65–99)
Potassium: 3.8 mmol/L (ref 3.5–5.1)
Sodium: 137 mmol/L (ref 135–145)

## 2016-11-08 MED ORDER — ESOMEPRAZOLE MAGNESIUM 20 MG PO CPDR: 20.0000 mg | DELAYED_RELEASE_CAPSULE | Freq: Every day | ORAL | 2 refills | Status: DC

## 2016-11-08 MED ORDER — METFORMIN HCL 500 MG PO TABS: 500.0000 mg | ORAL_TABLET | Freq: Two times a day (BID) | ORAL | 2 refills | Status: DC

## 2016-11-08 MED ORDER — AZITHROMYCIN 250 MG PO TABS: ORAL_TABLET | ORAL | 0 refills | Status: DC

## 2016-11-08 MED ORDER — DEXTROMETHORPHAN-GUAIFENESIN 15-400 MG PO TABS: 1.0000 | ORAL_TABLET | ORAL | 0 refills | Status: DC | PRN

## 2016-11-08 NOTE — Discharge Instructions (Signed)
Contact a health care provider if:  You have new symptoms.  You cough up pus.  Your cough does not get better after 2-3 weeks, or your cough gets worse.  You cannot control your cough with suppressant medicines and you are losing sleep.  You develop pain that is getting worse or pain that is not controlled with pain medicines.  You have a fever.  You have unexplained weight loss.  You have night sweats.  Get help right away if:  You cough up blood.  You have difficulty breathing.  Your heartbeat is very fast.

## 2016-11-08 NOTE — ED Notes (Signed)
Patient Alert and oriented X4. Stable and ambulatory. Patient verbalized understanding of the discharge instructions.  Patient belongings were taken by the patient.  

## 2016-11-10 ENCOUNTER — Telehealth: Payer: Self-pay

## 2016-11-10 NOTE — Telephone Encounter (Signed)
Message received from Michel BickersWandalyn Rogers, RN CM requesting a hospital follow up appointment @ [facility] for the patient. Call placed to the patient and an appointment was scheduled for 11/12/16 @ 1030. Provided him with the address of the clinic and instructed him to call the clinic to cancel or reschedule if he is not able to keep the appointment.  Update provided to Beecher McardleW. Rogers, RN CM

## 2016-11-12 ENCOUNTER — Inpatient Hospital Stay: Payer: Self-pay

## 2016-11-13 ENCOUNTER — Encounter: Payer: Self-pay | Admitting: Physician Assistant

## 2016-11-13 ENCOUNTER — Ambulatory Visit: Payer: Self-pay | Attending: Internal Medicine | Admitting: Physician Assistant

## 2016-11-13 VITALS — BP 142/87 | HR 79 | Temp 98.1°F | Resp 16 | Wt 129.6 lb

## 2016-11-13 DIAGNOSIS — Z7984 Long term (current) use of oral hypoglycemic drugs: Secondary | ICD-10-CM | POA: Insufficient documentation

## 2016-11-13 DIAGNOSIS — E1165 Type 2 diabetes mellitus with hyperglycemia: Secondary | ICD-10-CM | POA: Insufficient documentation

## 2016-11-13 DIAGNOSIS — R030 Elevated blood-pressure reading, without diagnosis of hypertension: Secondary | ICD-10-CM | POA: Insufficient documentation

## 2016-11-13 DIAGNOSIS — J209 Acute bronchitis, unspecified: Secondary | ICD-10-CM | POA: Insufficient documentation

## 2016-11-13 DIAGNOSIS — K219 Gastro-esophageal reflux disease without esophagitis: Secondary | ICD-10-CM | POA: Insufficient documentation

## 2016-11-13 LAB — POCT GLYCOSYLATED HEMOGLOBIN (HGB A1C): Hemoglobin A1C: 11.9

## 2016-11-13 LAB — GLUCOSE, POCT (MANUAL RESULT ENTRY)
POC GLUCOSE: 277 mg/dL — AB (ref 70–99)
POC GLUCOSE: 264 mg/dL — AB (ref 70–99)
POC Glucose: 284 mg/dl — AB (ref 70–99)

## 2016-11-13 MED ORDER — TRUEPLUS LANCETS 28G MISC: 2 refills | Status: DC

## 2016-11-13 MED ORDER — GLUCOSE BLOOD VI STRP: ORAL_STRIP | 12 refills | Status: DC

## 2016-11-13 MED ORDER — INSULIN ASPART 100 UNIT/ML ~~LOC~~ SOLN
10.0000 [IU] | Freq: Once | SUBCUTANEOUS | Status: AC
  Administered 2016-11-13: 10 [IU] via SUBCUTANEOUS

## 2016-11-13 MED ORDER — TRUE METRIX METER W/DEVICE KIT: PACK | 0 refills | Status: DC

## 2016-11-13 MED ORDER — BENZONATATE 100 MG PO CAPS: 200.0000 mg | ORAL_CAPSULE | Freq: Three times a day (TID) | ORAL | 0 refills | Status: DC | PRN

## 2016-11-13 MED ORDER — DEXTROMETHORPHAN-GUAIFENESIN 15-400 MG PO TABS
1.0000 | ORAL_TABLET | ORAL | 0 refills | Status: DC | PRN
Start: 2016-11-13 — End: 2019-01-03

## 2016-11-13 MED ORDER — METFORMIN HCL 500 MG PO TABS: 1000.0000 mg | ORAL_TABLET | Freq: Two times a day (BID) | ORAL | 2 refills | Status: DC

## 2016-11-13 MED ORDER — ESOMEPRAZOLE MAGNESIUM 20 MG PO CPDR: 20.0000 mg | DELAYED_RELEASE_CAPSULE | Freq: Every day | ORAL | 2 refills | Status: DC

## 2016-11-13 MED ORDER — AZITHROMYCIN 250 MG PO TABS: ORAL_TABLET | ORAL | 0 refills | Status: DC

## 2016-11-13 MED FILL — metFORMIN HCL 500 MG TABS: 500 | 30 days supply | Qty: 120 | Fill #0

## 2016-11-13 MED FILL — TRUE METRIX TEST STRIP: 30 days supply | Qty: 100 | Fill #0

## 2016-11-13 MED FILL — AZITHROMYCIN 250 MG TABLET: 250 | 5 days supply | Qty: 5 | Fill #0

## 2016-11-13 MED FILL — TRUEplus LANCETS 28G MISC: 30 days supply | Qty: 100 | Fill #0

## 2016-11-13 MED FILL — !TRUE METRIX BLOOD GLUCOSE: 1 days supply | Qty: 1 | Fill #0

## 2016-11-13 NOTE — Progress Notes (Signed)
Gregory Schaefer, is a 56 y.o. male  GGE:[id number]  YYT:[id number]  DOB - 1961/07/29  Subjective:  Chief Complaint and HPI: Gregory Schaefer is a 56 y.o. male here today to establish care and for a follow up visit after being seen in the ED 11/07/2016 for URI and uncontrolled diabetes.  He was previously on metformin for diabetes but hasn't taken medications for more than 1 year.  He was prescribed antibiotics in the ED but hasn't been able to get them secondary to cost. His cough has not improved.  He denies fever.  He has never been diagnosed with high blood pressure. He smokes 1pk cigs every 3 days.  He has been having some erectile difficulties.  ED/Hospital notes reviewed.     ROS:   Constitutional:  No f/c, No night sweats, No unexplained weight loss. EENT:  No vision changes, No blurry vision, No hearing changes. No mouth, throat, or ear problems.  Respiratory: + cough, No SOB Cardiac: No CP, no palpitations GI:  No abd pain, No N/V/D. GU: No Urinary s/sx Musculoskeletal: No joint pain Neuro: No headache, no dizziness, no motor weakness.  Skin: No rash Endocrine:  No polydipsia. No polyuria.  Psych: Denies SI/HI  No problems updated.  ALLERGIES: No Known Allergies  PAST MEDICAL HISTORY: Past Medical History:  Diagnosis Date  . Diabetes mellitus without complication ([place])     MEDICATIONS AT HOME: Prior to Admission medications   Medication Sig Start Date End Date Taking? Authorizing Provider  azithromycin (ZITHROMAX Z-PAK) 250 MG tablet 2 po day one, then 1 daily x 4 days 11/13/16   Argentina Donovan, PA-C  benzonatate (TESSALON) 100 MG capsule Take 2 capsules (200 mg total) by mouth 3 (three) times daily as needed for cough. 11/13/16   Argentina Donovan, PA-C  Blood Glucose Monitoring Suppl (TRUE METRIX METER) w/Device KIT Check blood sugar fasting in the morning and at bedtime 11/13/16   Argentina Donovan, PA-C  cetirizine (ZYRTEC ALLERGY) 10 MG tablet Take 1 tablet (10 mg total)  by mouth daily. Patient not taking: Reported on 11/13/2016 10/17/16   Montine Circle, PA-C  Dextromethorphan-Guaifenesin 15-400 MG TABS Take 1 tablet by mouth every 4 (four) hours as needed. For cough 11/13/16   Argentina Donovan, PA-C  esomeprazole (NEXIUM) 20 MG capsule Take 1 capsule (20 mg total) by mouth daily. 11/13/16   Argentina Donovan, PA-C  glucose blood (TRUE METRIX BLOOD GLUCOSE TEST) test strip Use as instructed 11/13/16   Argentina Donovan, PA-C  metFORMIN (GLUCOPHAGE) 500 MG tablet Take 2 tablets (1,000 mg total) by mouth 2 (two) times daily with a meal. 11/13/16   Argentina Donovan, PA-C  TRUEPLUS LANCETS 28G MISC Check blood sugar fasting and at bedtime 11/13/16   Argentina Donovan, PA-C     Objective:  EXAM:   Vitals:   11/13/16 1346  BP: (!) 142/87  Pulse: 79  Resp: 16  Temp: 98.1 F (36.7 C)  TempSrc: Oral  SpO2: 97%  Weight: 129 lb 9.6 oz (58.8 kg)    General appearance : A&OX3. NAD. Non-toxic-appearing HEENT: Atraumatic and Normocephalic.  PERRLA. EOM intact.  TM clear B. Mouth-MMM, post pharynx WNL w/o erythema, No PND. Neck: supple, no JVD. No cervical lymphadenopathy. No thyromegaly Chest/Lungs:  Breathing-non-labored, Good air entry bilaterally, breath sounds normal overall without rales, wheezing.  There are some mild Rhonchi scattered throughout CVS: S1 S2 regular, no murmurs, gallops, rubs  Extremities: Bilateral Lower Ext shows no edema,  both legs are warm to touch with = pulse throughout Neurology:  CN II-XII grossly intact, Non focal.   Psych:  TP linear. J/I WNL. Normal speech. Appropriate eye contact and affect.  Skin:  No Rash  Data Review Lab Results  Component Value Date   HGBA1C 11.9 11/13/2016     Assessment & Plan   1. Type 2 diabetes mellitus with hyperglycemia, without long-term current use of insulin (HCC) Check blood sugar fasting in the mornings and at bedtime and record and bring to next visit. - Glucose (CBG) - POCT glycosylated  hemoglobin (Hb A1C) - insulin aspart (novoLOG) injection 10 Units; Inject 0.1 mLs (10 Units total) into the skin once. Restart and gradually increase dose as directed on AVS- metFORMIN (GLUCOPHAGE) 500 MG tablet; Take 2 tablets (1,000 mg total) by mouth 2 (two) times daily with a meal.  Dispense: 120 tablet; Refill: 2 - Blood Glucose Monitoring Suppl (TRUE METRIX METER) w/Device KIT; Check blood sugar fasting in the morning and at bedtime  Dispense: 1 kit; Refill: 0 - glucose blood (TRUE METRIX BLOOD GLUCOSE TEST) test strip; Use as instructed  Dispense: 100 each; Refill: 12 - TRUEPLUS LANCETS 28G MISC; Check blood sugar fasting and at bedtime  Dispense: 100 each; Refill: 2 - POCT glucose (manual entry) - insulin aspart (novoLOG) injection 10 Units; Inject 0.1 mLs (10 Units total) into the skin once.  2. Elevated BP without diagnosis of hypertension Check blood pressure 3 times per week and record and bring to your next visit.  3. Acute bronchitis, unspecified organism - azithromycin (ZITHROMAX Z-PAK) 250 MG tablet; 2 po day one, then 1 daily x 4 days  Dispense: 5 tablet; Refill: 0 - benzonatate (TESSALON) 100 MG capsule; Take 2 capsules (200 mg total) by mouth 3 (three) times daily as needed for cough.  Dispense: 20 capsule; Refill: 0 - Dextromethorphan-Guaifenesin 15-400 MG TABS; Take 1 tablet by mouth every 4 (four) hours as needed. For cough  Dispense: 30 each; Refill: 0  4. Gastroesophageal reflux disease without esophagitis - esomeprazole (NEXIUM) 20 MG capsule; Take 1 capsule (20 mg total) by mouth daily.  Dispense: 30 capsule; Refill: 2   Patient have been counseled extensively about nutrition and exercise.  Smoking cessation advised and congratulated him on how much he has cut back.  He can discuss ED meds with his PCP at f/up, but I assured him smoking cessation and glycemic control would help.    Return in about 3 weeks (around 12/04/2016) for assign PCP and f/up diabetes and elevated  BP.  The patient was given clear instructions to go to ER or return to medical center if symptoms don't improve, worsen or new problems develop. The patient verbalized understanding. The patient was told to call to get lab results if they haven't heard anything in the next week.     Freeman Caldron, PA-C [facility] [place], [place]   11/13/2016, 2:13 PM

## 2016-11-13 NOTE — Patient Instructions (Addendum)
Check blood sugar fasting in the mornings and at bedtime and record and bring to next visit.   Check blood pressure 3 times per week and record and bring to your next visit.  On metformin-start out taking one tablet daily today through Sunday.  Monday start 1 tablet twice daily.  After 1 week, take 2 tablets twice daily.

## 2016-12-16 ENCOUNTER — Other Ambulatory Visit: Payer: Self-pay | Admitting: Family Medicine

## 2016-12-16 ENCOUNTER — Ambulatory Visit: Payer: Self-pay | Attending: Family Medicine | Admitting: Family Medicine

## 2016-12-16 VITALS — BP 109/66 | HR 98 | Temp 97.5°F | Wt 129.0 lb

## 2016-12-16 DIAGNOSIS — H538 Other visual disturbances: Secondary | ICD-10-CM

## 2016-12-16 DIAGNOSIS — Z7984 Long term (current) use of oral hypoglycemic drugs: Secondary | ICD-10-CM | POA: Insufficient documentation

## 2016-12-16 DIAGNOSIS — N529 Male erectile dysfunction, unspecified: Secondary | ICD-10-CM | POA: Insufficient documentation

## 2016-12-16 DIAGNOSIS — E119 Type 2 diabetes mellitus without complications: Secondary | ICD-10-CM | POA: Insufficient documentation

## 2016-12-16 LAB — GLUCOSE, POCT (MANUAL RESULT ENTRY): POC Glucose: 245 mg/dl — AB (ref 70–99)

## 2016-12-16 MED ORDER — METFORMIN HCL ER (MOD) 1000 MG PO TB24: 1000.0000 mg | ORAL_TABLET | Freq: Two times a day (BID) | ORAL | 2 refills | Status: DC

## 2016-12-16 MED ORDER — GLIPIZIDE 5 MG PO TABS: 5.0000 mg | ORAL_TABLET | Freq: Two times a day (BID) | ORAL | 2 refills | Status: DC

## 2016-12-16 MED ORDER — METFORMIN HCL ER 500 MG PO TB24: 1000.0000 mg | ORAL_TABLET | Freq: Two times a day (BID) | ORAL | 2 refills | Status: DC

## 2016-12-16 MED ORDER — TADALAFIL 20 MG PO TABS: 10.0000 mg | ORAL_TABLET | Freq: Every day | ORAL | 11 refills | Status: DC | PRN

## 2016-12-16 MED FILL — !CIALIS 20 MG TABLET: 20 | 30 days supply | Qty: 5 | Fill #0

## 2016-12-16 MED FILL — glipiZIDE 5 MG TABS: 5 | 30 days supply | Qty: 60 | Fill #0

## 2016-12-16 NOTE — Progress Notes (Signed)
JA 

## 2016-12-16 NOTE — Patient Instructions (Signed)
Glipizide tablets What is this medicine? GLIPIZIDE (GLIP i zide) helps to treat type 2 diabetes. Treatment is combined with diet and exercise. The medicine helps your body to use insulin better. This medicine may be used for other purposes; ask your health care provider or pharmacist if you have questions. COMMON BRAND NAME(S): Glucotrol What should I tell my health care provider before I take this medicine? They need to know if you have any of these conditions: -diabetic ketoacidosis -glucose-6-phosphate dehydrogenase deficiency -heart disease -kidney disease -liver disease -porphyria -severe infection or injury -thyroid disease -an unusual or allergic reaction to glipizide, sulfa drugs, other medicines, foods, dyes, or preservatives -pregnant or trying to get pregnant -breast-feeding How should I use this medicine? Take this medicine by mouth. Swallow with a drink of water. Do not take with food. Take it 30 minutes before a meal. Follow the directions on the prescription label. If you take this medicine once a day, take it 30 minutes before breakfast. Take your doses at the same time each day. Do not take more often than directed. Talk to your pediatrician regarding the use of this medicine in children. Special care may be needed. Elderly patients over 65 years old may have a stronger reaction and need a smaller dose. Overdosage: If you think you have taken too much of this medicine contact a poison control center or emergency room at once. NOTE: This medicine is only for you. Do not share this medicine with others. What if I miss a dose? If you miss a dose, take it as soon as you can. If it is almost time for your next dose, take only that dose. Do not take double or extra doses. What may interact with this medicine? -bosentan -chloramphenicol -cisapride -clarithromycin -medicines for fungal or yeast infections -metoclopramide -probenecid -warfarin Many medications may cause an  increase or decrease in blood sugar, these include: -alcohol containing beverages -aspirin and aspirin-like drugs -chloramphenicol -chromium -diuretics -male hormones, like estrogens or progestins and birth control pills -heart medicines -isoniazid -male hormones or anabolic steroids -medicines for weight loss -medicines for allergies, asthma, cold, or cough -medicines for mental problems -medicines called MAO Inhibitors like Nardil, Parnate, Marplan, Eldepryl -niacin -NSAIDs, medicines for pain and inflammation, like ibuprofen or naproxen -pentamidine -phenytoin -probenecid -quinolone antibiotics like ciprofloxacin, levofloxacin, ofloxacin -some herbal dietary supplements -steroid medicines like prednisone or cortisone -thyroid medicine This list may not describe all possible interactions. Give your health care provider a list of all the medicines, herbs, non-prescription drugs, or dietary supplements you use. Also tell them if you smoke, drink alcohol, or use illegal drugs. Some items may interact with your medicine. What should I watch for while using this medicine? Visit your doctor or health care professional for regular checks on your progress. A test called the HbA1C (A1C) will be monitored. This is a simple blood test. It measures your blood sugar control over the last 2 to 3 months. You will receive this test every 3 to 6 months. Learn how to check your blood sugar. Learn the symptoms of low and high blood sugar and how to manage them. Always carry a quick-source of sugar with you in case you have symptoms of low blood sugar. Examples include hard sugar candy or glucose tablets. Make sure others know that you can choke if you eat or drink when you develop serious symptoms of low blood sugar, such as seizures or unconsciousness. They must get medical help at once. Tell your doctor or health   care professional if you have high blood sugar. You might need to change the dose of  your medicine. If you are sick or exercising more than usual, you might need to change the dose of your medicine. Do not skip meals. Ask your doctor or health care professional if you should avoid alcohol. Many nonprescription cough and cold products contain sugar or alcohol. These can affect blood sugar. This medicine can make you more sensitive to the sun. Keep out of the sun. If you cannot avoid being in the sun, wear protective clothing and use sunscreen. Do not use sun lamps or tanning beds/booths. Wear a medical ID bracelet or chain, and carry a card that describes your disease and details of your medicine and dosage times. What side effects may I notice from receiving this medicine? Side effects that you should report to your doctor or health care professional as soon as possible: -allergic reactions like skin rash, itching or hives, swelling of the face, lips, or tongue -breathing problems -dark urine -fever, chills, sore throat -signs and symptoms of low blood sugar such as feeling anxious, confusion, dizziness, increased hunger, unusually weak or tired, sweating, shakiness, cold, irritable, headache, blurred vision, fast heartbeat, loss of consciousness -unusual bleeding or bruising -yellowing of the eyes or skin Side effects that usually do not require medical attention (report to your doctor or health care professional if they continue or are bothersome): -diarrhea -dizziness -headache -heartburn -nausea -stomach gas This list may not describe all possible side effects. Call your doctor for medical advice about side effects. You may report side effects to FDA at 1-800-FDA-1088. Where should I keep my medicine? Keep out of the reach of children. Store at room temperature below 30 degrees C (86 degrees F). Throw away any unused medicine after the expiration date. NOTE: This sheet is a summary. It may not cover all possible information. If you have questions about this medicine, talk  to your doctor, pharmacist, or health care provider.  2017 Elsevier/Gold Standard (2013-01-25 14:42:46)

## 2016-12-16 NOTE — Progress Notes (Signed)
Subjective:  Patient ID: Gregory Schaefer, male    DOB: January 27, 1961  Age: 56 y.o. MRN: [id number]  CC: No chief complaint on file.   HPI Gregory Schaefer presents for   1. Diabetes: He reports checking blood glucose regularly at home. Report blood glucose ranges between 140 to 190s at home. He denies any wounds, nausea, vomiting, or paresthesias. He does report history of blurred vision, especially at night for the past 2 weeks. He denied any halos around lights or eye pain.   2. Erectile dysfunction: Two-year history of difficulty getting and maintaining an erection. He is requesting medication to help with symptoms. Denies any significant cardiac history.     Outpatient Medications Prior to Visit  Medication Sig Dispense Refill  . azithromycin (ZITHROMAX Z-PAK) 250 MG tablet 2 po day one, then 1 daily x 4 days 5 tablet 0  . benzonatate (TESSALON) 100 MG capsule Take 2 capsules (200 mg total) by mouth 3 (three) times daily as needed for cough. 20 capsule 0  . Blood Glucose Monitoring Suppl (TRUE METRIX METER) w/Device KIT Check blood sugar fasting in the morning and at bedtime 1 kit 0  . cetirizine (ZYRTEC ALLERGY) 10 MG tablet Take 1 tablet (10 mg total) by mouth daily. (Patient not taking: Reported on 11/13/2016) 30 tablet 1  . Dextromethorphan-Guaifenesin 15-400 MG TABS Take 1 tablet by mouth every 4 (four) hours as needed. For cough 30 each 0  . esomeprazole (NEXIUM) 20 MG capsule Take 1 capsule (20 mg total) by mouth daily. 30 capsule 2  . glucose blood (TRUE METRIX BLOOD GLUCOSE TEST) test strip Use as instructed 100 each 12  . TRUEPLUS LANCETS 28G MISC Check blood sugar fasting and at bedtime 100 each 2  . metFORMIN (GLUCOPHAGE) 500 MG tablet Take 2 tablets (1,000 mg total) by mouth 2 (two) times daily with a meal. 120 tablet 2   No facility-administered medications prior to visit.     ROS Review of Systems  Eyes: Positive for visual disturbance.  Respiratory: Negative.     Cardiovascular: Negative.   Gastrointestinal: Negative.   Endocrine: Negative.   Skin: Negative.         Objective:  BP 109/66 (BP Location: Left Arm, Patient Position: Sitting, Cuff Size: Normal)   Pulse 98   Temp 97.5 F (36.4 C) (Oral)   Wt 129 lb (58.5 kg)   BMI 20.20 kg/m   BP/Weight 12/16/2016 11/13/2016 0/21/1173  Systolic BP 567 014 103  Diastolic BP 66 87 93  Wt. (Lbs) 129 129.6 -  BMI 20.2 20.3 -     Physical Exam  Eyes: Conjunctivae are normal. Pupils are equal, round, and reactive to light.  Visual acuity screen.  Cardiovascular: Normal rate, regular rhythm, normal heart sounds and intact distal pulses.   Pulmonary/Chest: Effort normal and breath sounds normal.  Abdominal: Soft. Bowel sounds are normal.  Skin: Skin is warm and dry.  Nursing note and vitals reviewed.    Assessment & Plan:   Problem List Items Addressed This Visit      Endocrine   Diabetes mellitus without complication ([place]) - Primary   Relevant Medications   glipiZIDE (GLUCOTROL) 5 MG tablet   metFORMIN (GLUCOPHAGE-XR) 500 MG 24 hr tablet   Other Relevant Orders   Glucose (CBG) (Completed)   Lipid Panel (Completed)   Microalbumin/Creatinine Ratio, Urine (Completed)    Other Visit Diagnoses    Erectile dysfunction, unspecified erectile dysfunction type  Relevant Medications   tadalafil (CIALIS) 20 MG tablet   Other Relevant Orders   Testosterone Total,Free,Bio, Males (Completed)   Blurred vision, bilateral       Relevant Orders   Ambulatory referral to Ophthalmology      Meds ordered this encounter  Medications  . DISCONTD: metFORMIN (GLUMETZA) 1000 MG (MOD) 24 hr tablet    Sig: Take 1 tablet (1,000 mg total) by mouth 2 (two) times daily.    Dispense:  60 tablet    Refill:  2    Order Specific Question:   Supervising Provider    Answer:   Tresa Garter [id number]  . glipiZIDE (GLUCOTROL) 5 MG tablet    Sig: Take 1 tablet (5 mg total) by mouth 2 (two) times  daily before a meal.    Dispense:  60 tablet    Refill:  2    Order Specific Question:   Supervising Provider    Answer:   Tresa Garter [id number]  . tadalafil (CIALIS) 20 MG tablet    Sig: Take 0.5-1 tablets (10-20 mg total) by mouth daily as needed for erectile dysfunction.    Dispense:  5 tablet    Refill:  11    Order Specific Question:   Supervising Provider    Answer:   Tresa Garter [id number]  . metFORMIN (GLUCOPHAGE-XR) 500 MG 24 hr tablet    Sig: Take 2 tablets (1,000 mg total) by mouth 2 (two) times daily.    Dispense:  120 tablet    Refill:  2    Order Specific Question:   Supervising Provider    Answer:   Tresa Garter [[id number]]    Follow-up: Return in about 1 month (around 01/13/2017) for Diabetes with [facility].   Alfonse Spruce FNP

## 2016-12-17 LAB — TESTOSTERONE TOTAL,FREE,BIO, MALES
ALBUMIN: 4.4 g/dL (ref 3.6–5.1)
Sex Hormone Binding: 23 nmol/L (ref 10–50)
Testosterone, Bioavailable: 128.5 ng/dL (ref 110.0–575.0)
Testosterone, Free: 63.8 pg/mL (ref 46.0–224.0)
Testosterone: 369 ng/dL (ref 250–827)

## 2016-12-17 LAB — LIPID PANEL
CHOL/HDL RATIO: 3.9 ratio (ref <5.0)
Cholesterol: 183 mg/dL (ref <200)
HDL: 47 mg/dL (ref >40)
LDL Cholesterol: 110 mg/dL — ABNORMAL HIGH (ref <100)
Triglycerides: 132 mg/dL (ref <150)
VLDL: 26 mg/dL (ref <30)

## 2016-12-17 LAB — MICROALBUMIN / CREATININE URINE RATIO
CREATININE, URINE: 59 mg/dL (ref 20–370)
Microalb Creat Ratio: 5 mcg/mg creat (ref <30)
Microalb, Ur: 0.3 mg/dL

## 2016-12-23 ENCOUNTER — Other Ambulatory Visit: Payer: Self-pay | Admitting: Family Medicine

## 2016-12-23 DIAGNOSIS — E782 Mixed hyperlipidemia: Secondary | ICD-10-CM

## 2016-12-23 MED ORDER — ASPIRIN EC 81 MG PO TBEC: 81.0000 mg | DELAYED_RELEASE_TABLET | Freq: Every day | ORAL | 2 refills | Status: AC

## 2016-12-23 MED ORDER — ATORVASTATIN CALCIUM 20 MG PO TABS: 20.0000 mg | ORAL_TABLET | Freq: Every day | ORAL | 2 refills | Status: DC

## 2016-12-31 ENCOUNTER — Telehealth: Payer: Self-pay | Admitting: *Deleted

## 2016-12-31 NOTE — Telephone Encounter (Signed)
-----   Message from Lizbeth BarkMandesia R Hairston, [place] sent at 12/23/2016  4:29 AM EST ----- Microalbumin/creatinine ratio level was normal. This tests for protein in your urine that can indicate early signs of kidney damage. Testosterone levels are normal. Low testosterone can cause problems with libido, fatigue, and decreased muscle mass. -Lipid levels were elevated. This can increase your risk of heart disease. You have been prescribed atorvastatin and aspirin to help lower your risk. Recheck levels in 3 months.

## 2016-12-31 NOTE — Telephone Encounter (Signed)
Medical Assistant left message on patient's home and cell voicemail. Voicemail states to give a call back to Amna Welker with [facility] at [phone].  

## 2017-01-05 MED FILL — ATORVASTATIN 20 MG TABLET: 20 | 30 days supply | Qty: 30 | Fill #0

## 2017-01-05 MED FILL — METFORMIN HCL ER 500 MG TAB: 500 | 30 days supply | Qty: 120 | Fill #0

## 2017-01-20 ENCOUNTER — Ambulatory Visit: Payer: Self-pay | Admitting: Pharmacist

## 2017-06-24 ENCOUNTER — Emergency Department (HOSPITAL_COMMUNITY): Payer: Self-pay

## 2017-06-24 ENCOUNTER — Encounter (HOSPITAL_COMMUNITY): Payer: Self-pay | Admitting: Emergency Medicine

## 2017-06-24 DIAGNOSIS — F17210 Nicotine dependence, cigarettes, uncomplicated: Secondary | ICD-10-CM | POA: Insufficient documentation

## 2017-06-24 DIAGNOSIS — J020 Streptococcal pharyngitis: Secondary | ICD-10-CM | POA: Insufficient documentation

## 2017-06-24 DIAGNOSIS — E119 Type 2 diabetes mellitus without complications: Secondary | ICD-10-CM | POA: Insufficient documentation

## 2017-06-24 DIAGNOSIS — Z79899 Other long term (current) drug therapy: Secondary | ICD-10-CM | POA: Insufficient documentation

## 2017-06-24 DIAGNOSIS — Z7984 Long term (current) use of oral hypoglycemic drugs: Secondary | ICD-10-CM | POA: Insufficient documentation

## 2017-06-24 DIAGNOSIS — Z7982 Long term (current) use of aspirin: Secondary | ICD-10-CM | POA: Insufficient documentation

## 2017-06-24 LAB — CBC WITH DIFFERENTIAL/PLATELET
BASOS PCT: 0 %
Basophils Absolute: 0 10*3/uL (ref 0.0–0.1)
EOS ABS: 0 10*3/uL (ref 0.0–0.7)
Eosinophils Relative: 0 %
HCT: 35.6 % — ABNORMAL LOW (ref 39.0–52.0)
HEMOGLOBIN: 12.6 g/dL — AB (ref 13.0–17.0)
Lymphocytes Relative: 39 %
Lymphs Abs: 1.8 10*3/uL (ref 0.7–4.0)
MCH: 30.1 pg (ref 26.0–34.0)
MCHC: 35.4 g/dL (ref 30.0–36.0)
MCV: 85.2 fL (ref 78.0–100.0)
MONOS PCT: 10 %
Monocytes Absolute: 0.5 10*3/uL (ref 0.1–1.0)
NEUTROS PCT: 51 %
Neutro Abs: 2.3 10*3/uL (ref 1.7–7.7)
Platelets: 218 10*3/uL (ref 150–400)
RBC: 4.18 MIL/uL — AB (ref 4.22–5.81)
RDW: 12.7 % (ref 11.5–15.5)
WBC: 4.5 10*3/uL (ref 4.0–10.5)

## 2017-06-24 LAB — URINALYSIS, ROUTINE W REFLEX MICROSCOPIC
Bacteria, UA: NONE SEEN
Bilirubin Urine: NEGATIVE
GLUCOSE, UA: 150 mg/dL — AB
KETONES UR: NEGATIVE mg/dL
LEUKOCYTES UA: NEGATIVE
Nitrite: NEGATIVE
PH: 5 (ref 5.0–8.0)
Protein, ur: 30 mg/dL — AB
SPECIFIC GRAVITY, URINE: 1.027 (ref 1.005–1.030)
SQUAMOUS EPITHELIAL / LPF: NONE SEEN

## 2017-06-24 LAB — COMPREHENSIVE METABOLIC PANEL
ALK PHOS: 52 U/L (ref 38–126)
ALT: 19 U/L (ref 17–63)
ANION GAP: 15 (ref 5–15)
AST: 21 U/L (ref 15–41)
Albumin: 3.9 g/dL (ref 3.5–5.0)
BILIRUBIN TOTAL: 0.8 mg/dL (ref 0.3–1.2)
BUN: 15 mg/dL (ref 6–20)
CALCIUM: 9.1 mg/dL (ref 8.9–10.3)
CO2: 18 mmol/L — ABNORMAL LOW (ref 22–32)
Chloride: 96 mmol/L — ABNORMAL LOW (ref 101–111)
Creatinine, Ser: 1.13 mg/dL (ref 0.61–1.24)
Glucose, Bld: 288 mg/dL — ABNORMAL HIGH (ref 65–99)
Potassium: 4 mmol/L (ref 3.5–5.1)
Sodium: 129 mmol/L — ABNORMAL LOW (ref 135–145)
TOTAL PROTEIN: 7 g/dL (ref 6.5–8.1)

## 2017-06-24 LAB — PROTIME-INR
INR: 0.95
PROTHROMBIN TIME: 12.6 s (ref 11.4–15.2)

## 2017-06-24 LAB — I-STAT CG4 LACTIC ACID, ED: Lactic Acid, Venous: 1.36 mmol/L (ref 0.5–1.9)

## 2017-06-24 MED ORDER — IBUPROFEN 400 MG PO TABS
400.0000 mg | ORAL_TABLET | Freq: Once | ORAL | Status: AC
  Administered 2017-06-24: 400 mg via ORAL

## 2017-06-24 MED ORDER — IBUPROFEN 200 MG PO TABS
ORAL_TABLET | ORAL | Status: AC
  Filled 2017-06-24: qty 2

## 2017-06-24 NOTE — ED Triage Notes (Signed)
Pt states she is having generalized body ache with chills since last Sunday not getting any relief with OTC medication.

## 2017-06-25 ENCOUNTER — Emergency Department (HOSPITAL_COMMUNITY)
Admission: EM | Admit: 2017-06-25 | Discharge: 2017-06-25 | Disposition: A | Discharge status: Home / Self Care | Payer: Self-pay | Attending: Emergency Medicine | Admitting: Emergency Medicine

## 2017-06-25 DIAGNOSIS — J020 Streptococcal pharyngitis: Secondary | ICD-10-CM

## 2017-06-25 LAB — BASIC METABOLIC PANEL
ANION GAP: 9 (ref 5–15)
BUN: 15 mg/dL (ref 6–20)
CHLORIDE: 99 mmol/L — AB (ref 101–111)
CO2: 22 mmol/L (ref 22–32)
Calcium: 8.7 mg/dL — ABNORMAL LOW (ref 8.9–10.3)
Creatinine, Ser: 1.02 mg/dL (ref 0.61–1.24)
Glucose, Bld: 196 mg/dL — ABNORMAL HIGH (ref 65–99)
POTASSIUM: 3.7 mmol/L (ref 3.5–5.1)
SODIUM: 130 mmol/L — AB (ref 135–145)

## 2017-06-25 LAB — RAPID STREP SCREEN (MED CTR MEBANE ONLY): STREPTOCOCCUS, GROUP A SCREEN (DIRECT): POSITIVE — AB

## 2017-06-25 MED ORDER — SODIUM CHLORIDE 0.9 % IV BOLUS (SEPSIS)
1000.0000 mL | Freq: Once | INTRAVENOUS | Status: AC
  Administered 2017-06-25: 1000 mL via INTRAVENOUS

## 2017-06-25 MED ORDER — PENICILLIN G BENZATHINE 1200000 UNIT/2ML IM SUSP
1.2000 10*6.[IU] | Freq: Once | INTRAMUSCULAR | Status: AC
  Administered 2017-06-25: 1.2 10*6.[IU] via INTRAMUSCULAR
  Filled 2017-06-25: qty 2

## 2017-06-25 MED ORDER — KETOROLAC TROMETHAMINE 30 MG/ML IJ SOLN
30.0000 mg | Freq: Once | INTRAMUSCULAR | Status: AC
  Administered 2017-06-25: 30 mg via INTRAVENOUS
  Filled 2017-06-25: qty 1

## 2017-06-25 NOTE — ED Provider Notes (Signed)
[facility] DEPT Provider Note   CSN: [id number] Arrival date & time: 06/24/17  2116     History   Chief Complaint Chief Complaint  Patient presents with  . generalized body ache    HPI Gregory Schaefer is a 56 y.o. male.  -Patient with history of diabetes presenting with 4 day history of generalized body aches, headache, chills and diaphoresis. He has not checked his temperature. States he's been using Tylenol and ibuprofen at home with partial relief. Missed 2 days of work. He went back to work today because he was feeling somewhat better but now feels worse again with diffuse body aches and chills and cough. Cough productive of clear mucus. Complains of headache, throat pain, body aches and chills. Febrile in triage. One episode of posttussive emesis today. Otherwise no vomiting. No diarrhea. No chest pain or shortness of breath. No sick contacts or recent travel. No camping trips or tick bites. No rashes.   The history is provided by the patient.    Past Medical History:  Diagnosis Date  . Diabetes mellitus without complication [facility])     Patient Active Problem List   Diagnosis Date Noted  . Diabetes mellitus without complication ([place]) 80/16/5537    History reviewed. No pertinent surgical history.     Home Medications    Prior to Admission medications   Medication Sig Start Date End Date Taking? Authorizing Provider  aspirin EC 81 MG tablet Take 1 tablet (81 mg total) by mouth daily. 12/23/16   Alfonse Spruce, FNP  atorvastatin (LIPITOR) 20 MG tablet Take 1 tablet (20 mg total) by mouth daily. 12/23/16   Alfonse Spruce, FNP  azithromycin (ZITHROMAX Z-PAK) 250 MG tablet 2 po day one, then 1 daily x 4 days 11/13/16   Argentina Donovan, PA-C  benzonatate (TESSALON) 100 MG capsule Take 2 capsules (200 mg total) by mouth 3 (three) times daily as needed for cough. 11/13/16   Argentina Donovan, PA-C  Blood Glucose Monitoring Suppl (TRUE METRIX METER) w/Device KIT  Check blood sugar fasting in the morning and at bedtime 11/13/16   Argentina Donovan, PA-C  cetirizine (ZYRTEC ALLERGY) 10 MG tablet Take 1 tablet (10 mg total) by mouth daily. Patient not taking: Reported on 11/13/2016 10/17/16   Montine Circle, PA-C  Dextromethorphan-Guaifenesin 15-400 MG TABS Take 1 tablet by mouth every 4 (four) hours as needed. For cough 11/13/16   Argentina Donovan, PA-C  esomeprazole (NEXIUM) 20 MG capsule Take 1 capsule (20 mg total) by mouth daily. 11/13/16   Argentina Donovan, PA-C  glipiZIDE (GLUCOTROL) 5 MG tablet Take 1 tablet (5 mg total) by mouth 2 (two) times daily before a meal. 12/16/16   Fredia Beets R, FNP  glucose blood (TRUE METRIX BLOOD GLUCOSE TEST) test strip Use as instructed 11/13/16   Argentina Donovan, PA-C  metFORMIN (GLUCOPHAGE-XR) 500 MG 24 hr tablet Take 2 tablets (1,000 mg total) by mouth 2 (two) times daily. 12/16/16   Alfonse Spruce, FNP  tadalafil (CIALIS) 20 MG tablet Take 0.5-1 tablets (10-20 mg total) by mouth daily as needed for erectile dysfunction. 12/16/16   Alfonse Spruce, FNP  TRUEPLUS LANCETS 28G MISC Check blood sugar fasting and at bedtime 11/13/16   Argentina Donovan, PA-C    Family History History reviewed. No pertinent family history.  Social History Social History  Substance Use Topics  . Smoking status: Current Every Day Smoker    Types: Cigarettes  . Smokeless tobacco:  Never Used  . Alcohol use Yes     Allergies   Patient has no known allergies.   Review of Systems Review of Systems  Constitutional: Positive for activity change, appetite change, chills, fatigue and fever.  HENT: Positive for congestion, rhinorrhea and sore throat.   Eyes: Negative for visual disturbance.  Respiratory: Positive for cough. Negative for chest tightness.   Cardiovascular: Negative for chest pain.  Gastrointestinal: Positive for nausea and vomiting. Negative for abdominal pain and constipation.  Genitourinary: Negative  for testicular pain.  Musculoskeletal: Positive for arthralgias and myalgias.  Skin: Negative for rash.  Neurological: Positive for weakness and headaches. Negative for dizziness and light-headedness.    all other systems are negative except as noted in the HPI and PMH.    Physical Exam Updated Vital Signs BP 111/71 (BP Location: Right Arm)   Pulse 95   Temp (!) 100.8 F (38.2 C) (Oral)   Resp 18   Ht '5\' 7"'  (1.702 m)   Wt 61.2 kg (135 lb)   SpO2 99%   BMI 21.14 kg/m   Physical Exam  Constitutional: He is oriented to person, place, and time. He appears well-developed and well-nourished. No distress.  Nontoxic appearing  HENT:  Head: Normocephalic and atraumatic.  Right Ear: External ear normal.  Left Ear: External ear normal.  Mouth/Throat: No oropharyngeal exudate.  Mild OP erythema. No asymmetry or exudates  Eyes: Pupils are equal, round, and reactive to light. Conjunctivae and EOM are normal.  Neck: Normal range of motion. Neck supple.  No meningismus.  Cardiovascular: Normal rate, regular rhythm, normal heart sounds and intact distal pulses.   No murmur heard. Pulmonary/Chest: Effort normal and breath sounds normal. No respiratory distress. He exhibits no tenderness.  Abdominal: Soft. There is no tenderness. There is no rebound and no guarding.  Musculoskeletal: Normal range of motion. He exhibits no edema or tenderness.  Neurological: He is alert and oriented to person, place, and time. No cranial nerve deficit. He exhibits normal muscle tone. Coordination normal.   5/5 strength throughout. CN 2-12 intact.Equal grip strength.   Skin: Skin is warm.  Psychiatric: He has a normal mood and affect. His behavior is normal.  Nursing note and vitals reviewed.    ED Treatments / Results  Labs (all labs ordered are listed, but only abnormal results are displayed) Labs Reviewed  COMPREHENSIVE METABOLIC PANEL - Abnormal; Notable for the following:       Result Value    Sodium 129 (*)    Chloride 96 (*)    CO2 18 (*)    Glucose, Bld 288 (*)    All other components within normal limits  CBC WITH DIFFERENTIAL/PLATELET - Abnormal; Notable for the following:    RBC 4.18 (*)    Hemoglobin 12.6 (*)    HCT 35.6 (*)    All other components within normal limits  URINALYSIS, ROUTINE W REFLEX MICROSCOPIC - Abnormal; Notable for the following:    Glucose, UA 150 (*)    Hgb urine dipstick SMALL (*)    Protein, ur 30 (*)    All other components within normal limits  CULTURE, BLOOD (ROUTINE X 2)  CULTURE, BLOOD (ROUTINE X 2)  RAPID STREP SCREEN (NOT AT [facility])  PROTIME-INR  I-STAT CG4 LACTIC ACID, ED  I-STAT CG4 LACTIC ACID, ED    EKG  EKG Interpretation None       Radiology Dg Chest 2 View  Result Date: 06/24/2017 CLINICAL DATA:  Cough and chills for  4 days, improving yesterday but now worse tonight. EXAM: CHEST  2 VIEW COMPARISON:  11/07/2016 FINDINGS: The lungs are clear. The pulmonary vasculature is normal. Heart size is normal. Hilar and mediastinal contours are unremarkable. There is no pleural effusion. IMPRESSION: No active cardiopulmonary disease. Electronically Signed   By: Andreas Newport M.D.   On: 06/24/2017 22:32    Procedures Procedures (including critical care time)  Medications Ordered in ED Medications  ibuprofen (ADVIL,MOTRIN) 200 MG tablet (not administered)  sodium chloride 0.9 % bolus 1,000 mL (not administered)  ketorolac (TORADOL) 30 MG/ML injection 30 mg (not administered)  ibuprofen (ADVIL,MOTRIN) tablet 400 mg (400 mg Oral Given 06/24/17 2147)     Initial Impression / Assessment and Plan / ED Course  I have reviewed the triage vital signs and the nursing notes.  Pertinent labs & imaging results that were available during my care of the patient were reviewed by me and considered in my medical decision making (see chart for details).    4 days of body aches, headache, chills and fever. Nontoxic appearing. Febrile in  triage. Lungs are clear. No meningismus.  Chest x-ray shows no pneumonia. Lactate is normal. Labs with hyperglycemia without DKA. UA negative.   Patient feels improved after IV fluids and antipyretics. Rapid strep is positive. Treated with IM Bicillin. Urinalysis is negative.  Discussed supportive care at home including by mouth hydration, antipyretics and PCP follow-up. Return precautions discussed.  Final Clinical Impressions(s) / ED Diagnoses   Final diagnoses:  Strep pharyngitis    New Prescriptions New Prescriptions   No medications on file     Ezequiel Essex, MD 06/25/17 [phone]

## 2017-06-25 NOTE — ED Notes (Signed)
Pt understood dc material. NAD noted. Work excuse given at dc 

## 2017-06-25 NOTE — Discharge Instructions (Signed)
Keep yourself hydrated.  Follow-up with your doctor.  Return to the ED if you develop new or worsening symptoms. °

## 2017-06-29 LAB — CULTURE, BLOOD (ROUTINE X 2)
CULTURE: NO GROWTH
Culture: NO GROWTH
SPECIAL REQUESTS: ADEQUATE
SPECIAL REQUESTS: ADEQUATE

## 2019-01-03 ENCOUNTER — Ambulatory Visit: Payer: Self-pay | Attending: Nurse Practitioner | Admitting: Nurse Practitioner

## 2019-01-03 ENCOUNTER — Encounter: Payer: Self-pay | Admitting: Nurse Practitioner

## 2019-01-03 VITALS — BP 153/82 | HR 64 | Temp 98.7°F | Ht 67.0 in | Wt 125.2 lb

## 2019-01-03 DIAGNOSIS — N529 Male erectile dysfunction, unspecified: Secondary | ICD-10-CM

## 2019-01-03 DIAGNOSIS — E119 Type 2 diabetes mellitus without complications: Secondary | ICD-10-CM

## 2019-01-03 DIAGNOSIS — K219 Gastro-esophageal reflux disease without esophagitis: Secondary | ICD-10-CM

## 2019-01-03 DIAGNOSIS — Z0000 Encounter for general adult medical examination without abnormal findings: Secondary | ICD-10-CM

## 2019-01-03 DIAGNOSIS — E1165 Type 2 diabetes mellitus with hyperglycemia: Secondary | ICD-10-CM

## 2019-01-03 DIAGNOSIS — E118 Type 2 diabetes mellitus with unspecified complications: Secondary | ICD-10-CM

## 2019-01-03 DIAGNOSIS — E782 Mixed hyperlipidemia: Secondary | ICD-10-CM

## 2019-01-03 DIAGNOSIS — Z1211 Encounter for screening for malignant neoplasm of colon: Secondary | ICD-10-CM

## 2019-01-03 LAB — POCT GLYCOSYLATED HEMOGLOBIN (HGB A1C): Hemoglobin A1C: 7.8 % — AB (ref 4.0–5.6)

## 2019-01-03 LAB — GLUCOSE, POCT (MANUAL RESULT ENTRY): POC Glucose: 189 mg/dl — AB (ref 70–99)

## 2019-01-03 MED ORDER — GLIPIZIDE 5 MG PO TABS: 5.0000 mg | ORAL_TABLET | Freq: Two times a day (BID) | ORAL | 2 refills | Status: DC

## 2019-01-03 MED ORDER — ATORVASTATIN CALCIUM 20 MG PO TABS: 20.0000 mg | ORAL_TABLET | Freq: Every day | ORAL | 2 refills | Status: DC

## 2019-01-03 MED ORDER — SILDENAFIL CITRATE 100 MG PO TABS: 50.0000 mg | ORAL_TABLET | Freq: Every day | ORAL | 11 refills | Status: DC | PRN

## 2019-01-03 MED ORDER — METFORMIN HCL ER 500 MG PO TB24: 500.0000 mg | ORAL_TABLET | Freq: Two times a day (BID) | ORAL | 3 refills | Status: DC

## 2019-01-03 MED ORDER — GLIPIZIDE 10 MG PO TABS: 10.0000 mg | ORAL_TABLET | Freq: Two times a day (BID) | ORAL | 3 refills | Status: DC

## 2019-01-03 MED ORDER — ESOMEPRAZOLE MAGNESIUM 20 MG PO CPDR: 20.0000 mg | DELAYED_RELEASE_CAPSULE | Freq: Every day | ORAL | 2 refills | Status: DC

## 2019-01-03 MED FILL — ESOMEPRAZOLE MAGNESIUM 20 M: 20 | 30 days supply | Qty: 30 | Fill #0

## 2019-01-03 MED FILL — ATORVASTATIN 20 MG TABLET: 20 | 30 days supply | Qty: 30 | Fill #0

## 2019-01-03 MED FILL — glipiZIDE 10 MG TABS: 10 | 30 days supply | Qty: 60 | Fill #0

## 2019-01-03 MED FILL — METFORMIN HCL ER 500 MG TAB: 500 | 30 days supply | Qty: 60 | Fill #0

## 2019-01-03 MED FILL — SILDENAFIL CITRATE 100 MG T: 100 | 30 days supply | Qty: 5 | Fill #0

## 2019-01-03 NOTE — Progress Notes (Signed)
Assessment & Plan:  Gregory Schaefer was seen today for establish care.  Diagnoses and all orders for this visit:  Poorly controlled type 2 diabetes mellitus with complication (HCC) -     Glucose (CBG) -     HgB A1c -     CBC -     CMP14+EGFR -     Lipid panel -     Microalbumin/Creatinine Ratio, Urine -     glipiZIDE (GLUCOTROL) 10 MG tablet; Take 1 tablet (10 mg total) by mouth 2 (two) times daily before a meal for 30 days. -     metFORMIN (GLUCOPHAGE-XR) 500 MG 24 hr tablet; Take 1 tablet (500 mg total) by mouth 2 (two) times daily for 30 days. -     TSH Continue blood sugar control as discussed in office today, low carbohydrate diet, and regular physical exercise as tolerated, 150 minutes per week (30 min each day, 5 days per week, or 50 min 3 days per week). Keep blood sugar logs with fasting goal of 90-130 mg/dl, post prandial (after you eat) less than 180.  For Hypoglycemia: BS <60 and Hyperglycemia BS >400; contact the clinic ASAP. Annual eye exams and foot exams are recommended.   Mixed hyperlipidemia -     atorvastatin (LIPITOR) 20 MG tablet; Take 1 tablet (20 mg total) by mouth daily.  Gastroesophageal reflux disease without esophagitis -     esomeprazole (NEXIUM) 20 MG capsule; Take 1 capsule (20 mg total) by mouth daily. INSTRUCTIONS: Avoid GERD Triggers: acidic, spicy or fried foods, caffeine, coffee, sodas,  alcohol and chocolate.   Erectile dysfunction, unspecified erectile dysfunction type -     sildenafil (VIAGRA) 100 MG tablet; Take 0.5-1 tablets (50-100 mg total) by mouth daily as needed for erectile dysfunction.  Routine adult health maintenance -     PSA  Colon cancer screening -     Fecal occult blood, imunochemical    Patient has been counseled on age-appropriate routine health concerns for screening and prevention. These are reviewed and up-to-date. Referrals have been placed accordingly. Immunizations are up-to-date or declined.    Subjective:   Chief  Complaint  Patient presents with  . Establish Care   HPI ELVA BREAKER 58 y.o. male presents to office today to establish care. He has a history of tobacco dependence, HPL, DM TYPE 2 and HTN.   DM TYPE 2 Chronic and improving. He endorses loose stools with taking the full dose of metformin. No side effects from glipizide. He stopped taking metformin 1000 mg XR due to GI upset. Will restart at 500 mg XR BID and increase glipizide from 5 mg BID to 10 mg. He denies any hypo or hyperglycemic symptoms. He is aware of how carbs affect diabetes and tries to limit his intake. 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  Lab Results  Component Value Date   HGBA1C 7.8 (A) 01/03/2019   Lab Results  Component Value Date   HGBA1C 11.9 11/13/2016   Mixed Hyperlipidemia Lab Results  Component Value Date   LDLCALC 110 (H) 12/16/2016  LDL not at goal. He has been out of atorvastatin 20 mg for a while now. Will refill today. He denies any statin intolerance or myalgias.     Elevated Blood Pressure He has never taken any antihypertensives in the past. Blood pressure has been well controlled in the past. Will recheck at next office visit. He does not monitor his blood  pressure at home. Denies chest pain, shortness of breath, palpitations, lightheadedness, dizziness, headaches or BLE edema.  BP Readings from Last 3 Encounters:  01/03/19 (!) 153/82  06/25/17 107/69  12/16/16 109/66      Erectile Dysfunction Difficulty achieving an erection. No difficulty maintaining erection.  No full erections upon awakening or with masturbation. Intermittent premature ejaculation but does not always occur   Tobacco Dependence Smoker >40 years. Stopped smoking in the past for 6 years. Currently smokes 1/2 PPD of cigarettes. Not ready to quit.     Review of Systems  Constitutional: Negative for fever, malaise/fatigue and weight loss.  HENT: Negative.  Negative  for nosebleeds.   Eyes: Negative.  Negative for blurred vision, double vision and photophobia.  Respiratory: Negative.  Negative for cough and shortness of breath.   Cardiovascular: Negative.  Negative for chest pain, palpitations and leg swelling.  Gastrointestinal: Positive for heartburn. Negative for nausea and vomiting.  Musculoskeletal: Negative.  Negative for myalgias.  Neurological: Negative.  Negative for dizziness, focal weakness, seizures and headaches.  Psychiatric/Behavioral: Negative.  Negative for suicidal ideas.    Past Medical History:  Diagnosis Date  . Diabetes mellitus without complication ([place])     History reviewed. No pertinent surgical history.  Family History  Problem Relation Age of Onset  . Diabetes Mother   . Hypertension Mother   . Alcohol abuse Father   . Diabetes Sister   . Cancer Maternal Aunt   . Diabetes Paternal Aunt   . Alcohol abuse Paternal Uncle   . Diabetes Paternal Grandmother     Social History Reviewed with no changes to be made today.   Outpatient Medications Prior to Visit  Medication Sig Dispense Refill  . aspirin EC 81 MG tablet Take 1 tablet (81 mg total) by mouth daily. 30 tablet 2  . Blood Glucose Monitoring Suppl (TRUE METRIX METER) w/Device KIT Check blood sugar fasting in the morning and at bedtime 1 kit 0  . glucose blood (TRUE METRIX BLOOD GLUCOSE TEST) test strip Use as instructed 100 each 12  . TRUEPLUS LANCETS 28G MISC Check blood sugar fasting and at bedtime 100 each 2  . atorvastatin (LIPITOR) 20 MG tablet Take 1 tablet (20 mg total) by mouth daily. 30 tablet 2  . esomeprazole (NEXIUM) 20 MG capsule Take 1 capsule (20 mg total) by mouth daily. 30 capsule 2  . glipiZIDE (GLUCOTROL) 5 MG tablet Take 1 tablet (5 mg total) by mouth 2 (two) times daily before a meal. 60 tablet 2  . metFORMIN (GLUCOPHAGE-XR) 500 MG 24 hr tablet Take 2 tablets (1,000 mg total) by mouth 2 (two) times daily. 120 tablet 2  . cetirizine (ZYRTEC  ALLERGY) 10 MG tablet Take 1 tablet (10 mg total) by mouth daily. (Patient not taking: Reported on 11/13/2016) 30 tablet 1  . azithromycin (ZITHROMAX Z-PAK) 250 MG tablet 2 po day one, then 1 daily x 4 days 5 tablet 0  . benzonatate (TESSALON) 100 MG capsule Take 2 capsules (200 mg total) by mouth 3 (three) times daily as needed for cough. 20 capsule 0  . Dextromethorphan-Guaifenesin 15-400 MG TABS Take 1 tablet by mouth every 4 (four) hours as needed. For cough 30 each 0  . tadalafil (CIALIS) 20 MG tablet Take 0.5-1 tablets (10-20 mg total) by mouth daily as needed for erectile dysfunction. (Patient not taking: Reported on 01/03/2019) 5 tablet 11   No facility-administered medications prior to visit.     No Known Allergies  Objective:    BP (!) 153/82 (BP Location: Right Arm, Patient Position: Sitting, Cuff Size: Normal)   Pulse 64   Temp 98.7 F (37.1 C) (Oral)   Ht '5\' 7"'  (1.702 m)   Wt 125 lb 3.2 oz (56.8 kg)   SpO2 97%   BMI 19.61 kg/m  Wt Readings from Last 3 Encounters:  01/03/19 125 lb 3.2 oz (56.8 kg)  06/24/17 135 lb (61.2 kg)  12/16/16 129 lb (58.5 kg)    Physical Exam Vitals signs and nursing note reviewed.  Constitutional:      Appearance: He is well-developed.  HENT:     Head: Normocephalic and atraumatic.  Neck:     Musculoskeletal: Normal range of motion.  Cardiovascular:     Rate and Rhythm: Normal rate and regular rhythm.     Heart sounds: Normal heart sounds. No murmur. No friction rub. No gallop.   Pulmonary:     Effort: Pulmonary effort is normal. No tachypnea or respiratory distress.     Breath sounds: Normal breath sounds. No decreased breath sounds, wheezing, rhonchi or rales.  Chest:     Chest wall: No tenderness.  Abdominal:     General: Bowel sounds are normal.     Palpations: Abdomen is soft.  Musculoskeletal: Normal range of motion.  Skin:    General: Skin is warm and dry.  Neurological:     Mental Status: He is alert and oriented to  person, place, and time.     Coordination: Coordination normal.  Psychiatric:        Behavior: Behavior normal. Behavior is cooperative.        Thought Content: Thought content normal.        Judgment: Judgment normal.          Patient has been counseled extensively about nutrition and exercise as well as the importance of adherence with medications and regular follow-up. The patient was given clear instructions to go to ER or return to medical center if symptoms don't improve, worsen or new problems develop. The patient verbalized understanding.   Follow-up: Return for see me in 2 months for physical.   Gildardo Pounds, FNP-BC [facility] and [facility] [place], [place]   01/03/2019, 2:25 PM

## 2019-01-04 LAB — TSH: TSH: 1.54 u[IU]/mL (ref 0.450–4.500)

## 2019-01-04 LAB — CMP14+EGFR
A/G RATIO: 2.4 — AB (ref 1.2–2.2)
ALK PHOS: 52 IU/L (ref 39–117)
ALT: 18 IU/L (ref 0–44)
AST: 17 IU/L (ref 0–40)
Albumin: 4.7 g/dL (ref 3.8–4.9)
BILIRUBIN TOTAL: 0.4 mg/dL (ref 0.0–1.2)
BUN/Creatinine Ratio: 14 (ref 9–20)
BUN: 12 mg/dL (ref 6–24)
CALCIUM: 9 mg/dL (ref 8.7–10.2)
CHLORIDE: 100 mmol/L (ref 96–106)
CO2: 22 mmol/L (ref 20–29)
Creatinine, Ser: 0.87 mg/dL (ref 0.76–1.27)
GFR calc Af Amer: 112 mL/min/{1.73_m2} (ref >59)
GFR calc non Af Amer: 96 mL/min/{1.73_m2} (ref >59)
Globulin, Total: 2 g/dL (ref 1.5–4.5)
Glucose: 186 mg/dL — ABNORMAL HIGH (ref 65–99)
POTASSIUM: 4.3 mmol/L (ref 3.5–5.2)
SODIUM: 139 mmol/L (ref 134–144)
Total Protein: 6.7 g/dL (ref 6.0–8.5)

## 2019-01-04 LAB — CBC
Hematocrit: 41.1 % (ref 37.5–51.0)
Hemoglobin: 13.8 g/dL (ref 13.0–17.7)
MCH: 31.4 pg (ref 26.6–33.0)
MCHC: 33.6 g/dL (ref 31.5–35.7)
MCV: 94 fL (ref 79–97)
PLATELETS: 298 10*3/uL (ref 150–450)
RBC: 4.39 x10E6/uL (ref 4.14–5.80)
RDW: 12.5 % (ref 11.6–15.4)
WBC: 5.9 10*3/uL (ref 3.4–10.8)

## 2019-01-04 LAB — MICROALBUMIN / CREATININE URINE RATIO
Creatinine, Urine: 80.6 mg/dL
MICROALB/CREAT RATIO: 10 mg/g{creat} (ref 0–29)
MICROALBUM., U, RANDOM: 7.7 ug/mL

## 2019-01-04 LAB — PSA: Prostate Specific Ag, Serum: 1.2 ng/mL (ref 0.0–4.0)

## 2019-01-04 LAB — LIPID PANEL
Chol/HDL Ratio: 2.6 ratio (ref 0.0–5.0)
Cholesterol, Total: 184 mg/dL (ref 100–199)
HDL: 72 mg/dL (ref >39)
LDL Calculated: 95 mg/dL (ref 0–99)
TRIGLYCERIDES: 83 mg/dL (ref 0–149)
VLDL Cholesterol Cal: 17 mg/dL (ref 5–40)

## 2019-01-09 ENCOUNTER — Telehealth: Payer: Self-pay

## 2019-01-09 NOTE — Telephone Encounter (Signed)
CMA spoke to patient to inform on results.  Pt. Verified DOB. Pt. Understood.  

## 2019-01-09 NOTE — Telephone Encounter (Signed)
-----   Message from Claiborne Rigg, NP sent at 01/09/2019  2:26 PM EDT ----- Labs do not show anemia. Your kidney and liver function are normal as well as your prostate level, thyroid level and cholesterol levels. Continue on the same medications for now and we will recheck your labs in a few months.

## 2019-03-06 ENCOUNTER — Ambulatory Visit: Payer: Self-pay | Admitting: Nurse Practitioner

## 2020-12-16 ENCOUNTER — Ambulatory Visit: Payer: Self-pay | Attending: Nurse Practitioner | Admitting: Nurse Practitioner

## 2020-12-16 ENCOUNTER — Other Ambulatory Visit: Payer: Self-pay

## 2020-12-16 ENCOUNTER — Encounter: Payer: Self-pay | Admitting: Nurse Practitioner

## 2020-12-16 ENCOUNTER — Other Ambulatory Visit: Payer: Self-pay | Admitting: Nurse Practitioner

## 2020-12-16 VITALS — BP 135/79 | HR 83 | Temp 97.6°F | Ht 67.0 in | Wt 124.0 lb

## 2020-12-16 DIAGNOSIS — Z130 Encounter for screening for diseases of the blood and blood-forming organs and certain disorders involving the immune mechanism: Secondary | ICD-10-CM

## 2020-12-16 DIAGNOSIS — E118 Type 2 diabetes mellitus with unspecified complications: Secondary | ICD-10-CM

## 2020-12-16 DIAGNOSIS — N529 Male erectile dysfunction, unspecified: Secondary | ICD-10-CM

## 2020-12-16 DIAGNOSIS — Z114 Encounter for screening for human immunodeficiency virus [HIV]: Secondary | ICD-10-CM

## 2020-12-16 DIAGNOSIS — K219 Gastro-esophageal reflux disease without esophagitis: Secondary | ICD-10-CM

## 2020-12-16 DIAGNOSIS — Z1159 Encounter for screening for other viral diseases: Secondary | ICD-10-CM

## 2020-12-16 DIAGNOSIS — Z1211 Encounter for screening for malignant neoplasm of colon: Secondary | ICD-10-CM

## 2020-12-16 DIAGNOSIS — Z113 Encounter for screening for infections with a predominantly sexual mode of transmission: Secondary | ICD-10-CM

## 2020-12-16 DIAGNOSIS — E1165 Type 2 diabetes mellitus with hyperglycemia: Secondary | ICD-10-CM

## 2020-12-16 DIAGNOSIS — E785 Hyperlipidemia, unspecified: Secondary | ICD-10-CM

## 2020-12-16 LAB — GLUCOSE, POCT (MANUAL RESULT ENTRY): POC Glucose: 165 mg/dl — AB (ref 70–99)

## 2020-12-16 LAB — POCT GLYCOSYLATED HEMOGLOBIN (HGB A1C): Hemoglobin A1C: 8.3 % — AB (ref 4.0–5.6)

## 2020-12-16 MED ORDER — SILDENAFIL CITRATE 100 MG PO TABS: 100.0000 mg | ORAL_TABLET | Freq: Every day | ORAL | 6 refills | Status: DC | PRN

## 2020-12-16 MED ORDER — TRUE METRIX METER W/DEVICE KIT: PACK | 0 refills | Status: AC

## 2020-12-16 MED ORDER — TRUEPLUS LANCETS 28G MISC: 2 refills | Status: DC

## 2020-12-16 MED ORDER — TRUE METRIX BLOOD GLUCOSE TEST VI STRP
ORAL_STRIP | 12 refills | Status: DC
Start: 2020-12-16 — End: 2022-06-30

## 2020-12-16 MED ORDER — METFORMIN HCL ER 500 MG PO TB24: 500.0000 mg | ORAL_TABLET | Freq: Two times a day (BID) | ORAL | 1 refills | Status: DC

## 2020-12-16 MED ORDER — ATORVASTATIN CALCIUM 20 MG PO TABS: 20.0000 mg | ORAL_TABLET | Freq: Every day | ORAL | 1 refills | Status: DC

## 2020-12-16 MED ORDER — GLIPIZIDE 5 MG PO TABS: 5.0000 mg | ORAL_TABLET | Freq: Two times a day (BID) | ORAL | 1 refills | Status: DC

## 2020-12-16 NOTE — Progress Notes (Signed)
Assessment & Plan:  Giordano was seen today for diabetes.  Diagnoses and all orders for this visit:  Poorly controlled type 2 diabetes mellitus with complication (HCC) -     Glucose (CBG) -     HgB A1c -     Microalbumin/Creatinine Ratio, Urine -     CMP14+EGFR -     glipiZIDE (GLUCOTROL) 5 MG tablet; Take 1 tablet (5 mg total) by mouth 2 (two) times daily before a meal. -     metFORMIN (GLUCOPHAGE-XR) 500 MG 24 hr tablet; Take 1 tablet (500 mg total) by mouth 2 (two) times daily. -     glucose blood (TRUE METRIX BLOOD GLUCOSE TEST) test strip; Use as instructed -     TRUEplus Lancets 28G MISC; Check blood sugar fasting and at bedtime -     Blood Glucose Monitoring Suppl (TRUE METRIX METER) w/Device KIT; Check blood sugar fasting in the morning and at bedtime -     Ambulatory referral to Ophthalmology Continue blood sugar control as discussed in office today, low carbohydrate diet, and regular physical exercise as tolerated, 150 minutes per week (30 min each day, 5 days per week, or 50 min 3 days per week). Keep blood sugar logs with fasting goal of 90-130 mg/dl, post prandial (after you eat) less than 180.  For Hypoglycemia: BS <60 and Hyperglycemia BS >400; contact the clinic ASAP. Annual eye exams and foot exams are recommended.   Gastroesophageal reflux disease without esophagitis INSTRUCTIONS: Avoid GERD Triggers: acidic, spicy or fried foods, caffeine, coffee, sodas,  alcohol and chocolate.  Well controlled without taking nexxium  Erectile dysfunction, unspecified erectile dysfunction type -     sildenafil (VIAGRA) 100 MG tablet; Take 1 tablet (100 mg total) by mouth daily as needed for erectile dysfunction.  Screening for deficiency anemia -     CBC  Dyslipidemia, goal LDL below 70 -     Lipid panel -     atorvastatin (LIPITOR) 20 MG tablet; Take 1 tablet (20 mg total) by mouth daily. INSTRUCTIONS: Work on a low fat, heart healthy diet and participate in regular aerobic  exercise program by working out at least 150 minutes per week; 5 days a week-30 minutes per day. Avoid red meat/beef/steak,  fried foods. junk foods, sodas, sugary drinks, unhealthy snacking, alcohol and smoking.  Drink at least 80 oz of water per day and monitor your carbohydrate intake daily.    Colon cancer screening -     Fecal occult blood, imunochemical  Need for hepatitis C screening test -     HCV Ab w Reflex to Quant PCR  Encounter for screening for HIV -     HIV antibody (with reflex)  Screening for STD (sexually transmitted disease) -     Urine cytology ancillary only    Patient has been counseled on age-appropriate routine health concerns for screening and prevention. These are reviewed and up-to-date. Referrals have been placed accordingly. Immunizations are up-to-date or declined.    Subjective:   Chief Complaint  Patient presents with  . Diabetes    Patient is here to follow up on diabetes.    HPI Gregory Schaefer 60 y.o. male presents to office today for follow up.  has a past medical history of Diabetes mellitus without complication ([place]), tobacco dependence  I have not seen Mr. Kamer in this office since 12-2018 He has not taken his glipizide in over a year. States blood glucose levels were around 95 and he  was experiencing hypoglycemic symptoms when taking glipizide 10 mg BID. Will decrease to 5 mg BID and continue on metformin 500 mg BID. He endorses GI symptoms of loose stools with doses higher than 500 mg. LDL not at goal. He has not been taking his atorvastatin. We discussed his risk score today and he agrees to take as prescribed.  Lab Results  Component Value Date   HGBA1C 8.3 (A) 12/16/2020   Lab Results  Component Value Date   HGBA1C 7.8 (A) 12/16/2020   Lab Results  Component Value Date   [place] 95 01/03/2019   Blood pressure is well controlled. Denies chest pain, shortness of breath, palpitations, lightheadedness, dizziness, headaches or BLE edema.  Wants to quit but not quite ready to stop smoking.  BP Readings from Last 3 Encounters:  12/16/20 135/79  01/03/19 (!) 153/82  06/25/17 107/69   The 10-year ASCVD risk score Mikey Bussing DC Jr., et al., 2013) is: 21.3%   Values used to calculate the score:     Age: 64 years     Sex: Male     Is Non-Hispanic African American: Yes     Diabetic: Yes     Tobacco smoker: Yes     Systolic Blood Pressure: 725 mmHg     Is BP treated: No     HDL Cholesterol: 72 mg/dL     Total Cholesterol: 184 mg/dL    ED Notes moderate improvement with erection when taking viagra.    Review of Systems  Constitutional: Negative for fever, malaise/fatigue and weight loss.  HENT: Negative.  Negative for nosebleeds.   Eyes: Negative.  Negative for blurred vision, double vision and photophobia.  Respiratory: Negative.  Negative for cough and shortness of breath.   Cardiovascular: Negative.  Negative for chest pain, palpitations and leg swelling.  Gastrointestinal: Positive for heartburn. Negative for nausea and vomiting.  Genitourinary:       ED  Musculoskeletal: Negative.  Negative for myalgias.  Neurological: Negative.  Negative for dizziness, focal weakness, seizures and headaches.  Psychiatric/Behavioral: Negative.  Negative for suicidal ideas.    Past Medical History:  Diagnosis Date  . Diabetes mellitus without complication ([place])     No past surgical history on file.  Family History  Problem Relation Age of Onset  . Diabetes Mother   . Hypertension Mother   . Alcohol abuse Father   . Diabetes Sister   . Cancer Maternal Aunt   . Diabetes Paternal Aunt   . Alcohol abuse Paternal Uncle   . Diabetes Paternal Grandmother     Social History Reviewed with no changes to be made today.   Outpatient Medications Prior to Visit  Medication Sig Dispense Refill  . aspirin EC 81 MG tablet Take 1 tablet (81 mg total) by mouth daily. 30 tablet 2  . esomeprazole (NEXIUM) 20 MG capsule Take 1 capsule (20  mg total) by mouth daily. 90 capsule 2  . Blood Glucose Monitoring Suppl (TRUE METRIX METER) w/Device KIT Check blood sugar fasting in the morning and at bedtime 1 kit 0  . glucose blood (TRUE METRIX BLOOD GLUCOSE TEST) test strip Use as instructed 100 each 12  . sildenafil (VIAGRA) 100 MG tablet Take 0.5-1 tablets (50-100 mg total) by mouth daily as needed for erectile dysfunction. 5 tablet 11  . TRUEPLUS LANCETS 28G MISC Check blood sugar fasting and at bedtime 100 each 2  . atorvastatin (LIPITOR) 20 MG tablet Take 1 tablet (20 mg total) by mouth daily. (Patient not  taking: Reported on 12/16/2020) 30 tablet 2  . cetirizine (ZYRTEC ALLERGY) 10 MG tablet Take 1 tablet (10 mg total) by mouth daily. (Patient not taking: Reported on 11/13/2016) 30 tablet 1  . glipiZIDE (GLUCOTROL) 10 MG tablet Take 1 tablet (10 mg total) by mouth 2 (two) times daily before a meal for 30 days. 60 tablet 3  . metFORMIN (GLUCOPHAGE-XR) 500 MG 24 hr tablet Take 1 tablet (500 mg total) by mouth 2 (two) times daily for 30 days. 60 tablet 3   No facility-administered medications prior to visit.    No Known Allergies     Objective:    BP 135/79 (BP Location: Left Arm, Patient Position: Sitting, Cuff Size: Normal)   Pulse 83   Temp 97.6 F (36.4 C) (Oral)   Ht 5' 7" (1.702 m)   Wt 124 lb (56.2 kg)   SpO2 98%   BMI 19.42 kg/m  Wt Readings from Last 3 Encounters:  12/16/20 124 lb (56.2 kg)  01/03/19 125 lb 3.2 oz (56.8 kg)  06/24/17 135 lb (61.2 kg)    Physical Exam Vitals and nursing note reviewed.  Constitutional:      Appearance: He is well-developed and well-nourished.  HENT:     Head: Normocephalic and atraumatic.  Eyes:     Extraocular Movements: EOM normal.  Cardiovascular:     Rate and Rhythm: Normal rate and regular rhythm.     Pulses: Intact distal pulses.     Heart sounds: Normal heart sounds. No murmur heard. No friction rub. No gallop.   Pulmonary:     Effort: Pulmonary effort is normal.  No tachypnea or respiratory distress.     Breath sounds: Normal breath sounds. No decreased breath sounds, wheezing, rhonchi or rales.  Chest:     Chest wall: No tenderness.  Abdominal:     General: Bowel sounds are normal.     Palpations: Abdomen is soft.  Musculoskeletal:        General: No edema. Normal range of motion.     Cervical back: Normal range of motion.  Skin:    General: Skin is warm and dry.  Neurological:     Mental Status: He is alert and oriented to person, place, and time.     Coordination: Coordination normal.  Psychiatric:        Mood and Affect: Mood and affect normal.        Behavior: Behavior normal. Behavior is cooperative.        Thought Content: Thought content normal.        Judgment: Judgment normal.          Patient has been counseled extensively about nutrition and exercise as well as the importance of adherence with medications and regular follow-up. The patient was given clear instructions to go to ER or return to medical center if symptoms don't improve, worsen or new problems develop. The patient verbalized understanding.   Follow-up: Return in about 3 months (around 03/15/2021).   Gildardo Pounds, FNP-BC [facility] and [facility] [place], [place]   12/16/2020, 4:47 PM

## 2020-12-17 LAB — CMP14+EGFR
ALT: 17 IU/L (ref 0–44)
AST: 17 IU/L (ref 0–40)
Albumin/Globulin Ratio: 1.8 (ref 1.2–2.2)
Albumin: 4.8 g/dL (ref 3.8–4.9)
Alkaline Phosphatase: 65 IU/L (ref 44–121)
BUN/Creatinine Ratio: 11 (ref 9–20)
BUN: 11 mg/dL (ref 6–24)
Bilirubin Total: 0.7 mg/dL (ref 0.0–1.2)
CO2: 22 mmol/L (ref 20–29)
Calcium: 10 mg/dL (ref 8.7–10.2)
Chloride: 99 mmol/L (ref 96–106)
Creatinine, Ser: 0.96 mg/dL (ref 0.76–1.27)
GFR calc Af Amer: 101 mL/min/{1.73_m2} (ref >59)
GFR calc non Af Amer: 87 mL/min/{1.73_m2} (ref >59)
Globulin, Total: 2.7 g/dL (ref 1.5–4.5)
Glucose: 150 mg/dL — ABNORMAL HIGH (ref 65–99)
Potassium: 4 mmol/L (ref 3.5–5.2)
Sodium: 139 mmol/L (ref 134–144)
Total Protein: 7.5 g/dL (ref 6.0–8.5)

## 2020-12-17 LAB — MICROALBUMIN / CREATININE URINE RATIO
Creatinine, Urine: 115.2 mg/dL
Microalb/Creat Ratio: 16 mg/g creat (ref 0–29)
Microalbumin, Urine: 18.5 ug/mL

## 2020-12-17 LAB — LIPID PANEL
Chol/HDL Ratio: 2.6 ratio (ref 0.0–5.0)
Cholesterol, Total: 228 mg/dL — ABNORMAL HIGH (ref 100–199)
HDL: 89 mg/dL (ref >39)
LDL Chol Calc (NIH): 126 mg/dL — ABNORMAL HIGH (ref 0–99)
Triglycerides: 76 mg/dL (ref 0–149)
VLDL Cholesterol Cal: 13 mg/dL (ref 5–40)

## 2020-12-17 LAB — CBC
Hematocrit: 39.4 % (ref 37.5–51.0)
Hemoglobin: 13.4 g/dL (ref 13.0–17.7)
MCH: 31.3 pg (ref 26.6–33.0)
MCHC: 34 g/dL (ref 31.5–35.7)
MCV: 92 fL (ref 79–97)
Platelets: 292 10*3/uL (ref 150–450)
RBC: 4.28 x10E6/uL (ref 4.14–5.80)
RDW: 12.2 % (ref 11.6–15.4)
WBC: 6.5 10*3/uL (ref 3.4–10.8)

## 2020-12-17 LAB — HCV AB W REFLEX TO QUANT PCR: HCV Ab: <0.1 s/co ratio (ref 0.0–0.9)

## 2020-12-17 LAB — HIV ANTIBODY (ROUTINE TESTING W REFLEX): HIV Screen 4th Generation wRfx: NONREACTIVE

## 2020-12-17 LAB — HCV INTERPRETATION

## 2020-12-17 MED FILL — ATORVASTATIN 20 MG TABLET: 20 | 30 days supply | Qty: 30 | Fill #0

## 2020-12-17 MED FILL — !TRUE METRIX BLOOD GLUCOSE: 1 days supply | Qty: 1 | Fill #0

## 2020-12-17 MED FILL — SILDENAFIL CITRATE 100 MG T: 100 | 30 days supply | Qty: 10 | Fill #0

## 2020-12-17 MED FILL — TRUEplus LANCETS 28G MISC: 50 days supply | Qty: 100 | Fill #0

## 2020-12-17 MED FILL — TRUE METRIX GLUCOSE TEST ST: 50 days supply | Qty: 100 | Fill #0

## 2020-12-17 MED FILL — metFORMIN HCL ER 500 MG TB2: 500 | 30 days supply | Qty: 60 | Fill #0

## 2020-12-17 MED FILL — glipiZIDE 5 MG TABS: 5 | 30 days supply | Qty: 60 | Fill #0

## 2020-12-19 LAB — URINE CYTOLOGY ANCILLARY ONLY
Bacterial Vaginitis-Urine: NEGATIVE
Candida Urine: NEGATIVE
Chlamydia: NEGATIVE
Comment: NEGATIVE
Comment: NEGATIVE
Comment: NORMAL
Neisseria Gonorrhea: NEGATIVE
Trichomonas: NEGATIVE

## 2020-12-20 ENCOUNTER — Telehealth: Payer: Self-pay | Admitting: Nurse Practitioner

## 2020-12-20 NOTE — Telephone Encounter (Signed)
Copied from CRM [id number]. Topic: General - Other >> Dec 20, 2020  2:49 PM Marylen Ponto wrote: Reason for CRM: Pt called for results of the urine sample. Pt requests call back

## 2020-12-20 NOTE — Telephone Encounter (Signed)
Patient has been informed of Cytology results. He Verbalized understanding

## 2020-12-20 NOTE — Telephone Encounter (Signed)
Pt is wanting to know results on urine only.

## 2021-03-25 ENCOUNTER — Ambulatory Visit: Payer: Self-pay | Admitting: Nurse Practitioner

## 2022-06-19 ENCOUNTER — Encounter: Payer: Self-pay | Admitting: Nurse Practitioner

## 2022-06-30 ENCOUNTER — Other Ambulatory Visit: Payer: Self-pay

## 2022-06-30 ENCOUNTER — Other Ambulatory Visit (HOSPITAL_COMMUNITY)
Admission: RE | Admit: 2022-06-30 | Discharge: 2022-06-30 | Disposition: A | Discharge status: Home / Self Care | Payer: Self-pay | Source: Ambulatory Visit | Attending: Nurse Practitioner | Admitting: Nurse Practitioner

## 2022-06-30 ENCOUNTER — Ambulatory Visit: Payer: Self-pay | Attending: Nurse Practitioner | Admitting: Nurse Practitioner

## 2022-06-30 ENCOUNTER — Encounter: Payer: Self-pay | Admitting: Nurse Practitioner

## 2022-06-30 VITALS — BP 137/73 | HR 71 | Ht 67.0 in | Wt 120.4 lb

## 2022-06-30 DIAGNOSIS — Z114 Encounter for screening for human immunodeficiency virus [HIV]: Secondary | ICD-10-CM

## 2022-06-30 DIAGNOSIS — Z7251 High risk heterosexual behavior: Secondary | ICD-10-CM | POA: Insufficient documentation

## 2022-06-30 DIAGNOSIS — K219 Gastro-esophageal reflux disease without esophagitis: Secondary | ICD-10-CM

## 2022-06-30 DIAGNOSIS — E1165 Type 2 diabetes mellitus with hyperglycemia: Secondary | ICD-10-CM

## 2022-06-30 DIAGNOSIS — N529 Male erectile dysfunction, unspecified: Secondary | ICD-10-CM

## 2022-06-30 DIAGNOSIS — E118 Type 2 diabetes mellitus with unspecified complications: Secondary | ICD-10-CM

## 2022-06-30 DIAGNOSIS — D649 Anemia, unspecified: Secondary | ICD-10-CM

## 2022-06-30 DIAGNOSIS — E785 Hyperlipidemia, unspecified: Secondary | ICD-10-CM

## 2022-06-30 DIAGNOSIS — Z23 Encounter for immunization: Secondary | ICD-10-CM

## 2022-06-30 MED ORDER — SILDENAFIL CITRATE 100 MG PO TABS
100.0000 mg | ORAL_TABLET | Freq: Every day | ORAL | 6 refills | Status: AC | PRN
  Filled 2022-06-30: qty 10, 30d supply, fill #0
  Filled 2022-12-19: qty 10, 30d supply, fill #1

## 2022-06-30 MED ORDER — ESOMEPRAZOLE MAGNESIUM 20 MG PO CPDR
20.0000 mg | DELAYED_RELEASE_CAPSULE | Freq: Every day | ORAL | 2 refills | Status: AC
  Filled 2022-06-30: qty 30, 30d supply, fill #0
  Filled 2022-12-19 – 2022-12-21 (×2): qty 30, 30d supply, fill #1

## 2022-06-30 MED ORDER — TRUEPLUS LANCETS 28G MISC
2 refills | Status: AC
  Filled 2022-06-30: qty 100, 30d supply, fill #0
  Filled 2022-12-19: qty 100, 30d supply, fill #1

## 2022-06-30 MED ORDER — ATORVASTATIN CALCIUM 20 MG PO TABS
20.0000 mg | ORAL_TABLET | Freq: Every day | ORAL | 3 refills | Status: AC
  Filled 2022-06-30: qty 30, 30d supply, fill #0
  Filled 2022-12-19 – 2022-12-21 (×2): qty 30, 30d supply, fill #1

## 2022-06-30 MED ORDER — GLIPIZIDE 5 MG PO TABS
5.0000 mg | ORAL_TABLET | Freq: Two times a day (BID) | ORAL | 1 refills | Status: DC
Start: 2022-06-30 — End: 2022-07-02
  Filled 2022-06-30: qty 60, 30d supply, fill #0

## 2022-06-30 MED ORDER — TRUE METRIX BLOOD GLUCOSE TEST VI STRP
ORAL_STRIP | 12 refills | Status: AC
  Filled 2022-06-30: qty 100, 30d supply, fill #0
  Filled 2022-12-19: qty 100, 30d supply, fill #1

## 2022-06-30 NOTE — Progress Notes (Addendum)
Assessment & Plan:  Gregory Schaefer was seen today for annual exam.  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 -     TRUEplus Lancets 28G MISC; Check blood sugar fasting and at bedtime -     glucose blood (TRUE METRIX BLOOD GLUCOSE TEST) test strip; Use as instructed Continue blood sugar control as discussed in office today, low carbohydrate diet, and regular physical exercise as tolerated, 150 minutes per week (30 min each day, 5 days per week, or 50 min 3 days per week). Keep blood sugar logs with fasting goal of 90-130 mg/dl, post prandial (after you eat) less than 180.  For Hypoglycemia: BS <60 and Hyperglycemia BS >400; contact the clinic ASAP. Annual eye exams and foot exams are recommended.   Dyslipidemia, goal LDL below 70 -     Lipid panel -     atorvastatin (LIPITOR) 20 MG tablet; Take 1 tablet (20 mg total) by mouth daily. INSTRUCTIONS: Work on a low fat, heart healthy diet and participate in regular aerobic exercise program by working out at least 150 minutes per week; 5 days a week-30 minutes per day. Avoid red meat/beef/steak,  fried foods. junk foods, sodas, sugary drinks, unhealthy snacking, alcohol and smoking.  Drink at least 80 oz of water per day and monitor your carbohydrate intake daily.    Anemia, unspecified type -     CBC with Differential  Encounter for screening for HIV -     HIV antibody (with reflex)  High risk heterosexual behavior -     Urine cytology ancillary only -     RPR -     HIV antibody (with reflex)  Need for shingles vaccine -     Varicella-zoster vaccine IM  Flu vaccine need -     Cancel: Flu Vaccine QUAD 21moIM (Fluarix, Fluzone & Alfiuria Quad PF)  Gastroesophageal reflux disease  without esophagitis -     esomeprazole (NEXIUM) 20 MG capsule; Take 1 capsule (20 mg total) by mouth daily.  Erectile dysfunction, unspecified erectile dysfunction type -     sildenafil (VIAGRA) 100 MG tablet; Take 1 tablet (100 mg total) by mouth daily as needed for erectile dysfunction.  Need for immunization against influenza -     Flu Vaccine QUAD 666moM (Fluarix, Fluzone & Alfiuria Quad PF)    Patient has been counseled on age-appropriate routine health concerns for screening and prevention. These are reviewed and up-to-date. Referrals have been placed accordingly. Immunizations are up-to-date or declined.    Subjective:   Chief Complaint  Patient presents with   Annual Exam   HPI Gregory Schaefer.o. male presents to office today for physical exam  He has a past medical history of DM, type 2 with complications, Hyperlipidemia, and Tobacco dependence.   He is accompanied by his fiance today. Requesting STD testing but denies any GU symptoms or recent transmission.   Blood pressure is well controlled. A1c and LDL not at goal. He admits to not taking his medications as prescribed. Stopped taking metformin due to GI upset.  BP Readings from Last 3 Encounters:  06/30/22 137/73  12/16/20 135/79  01/03/19 (!) 153/82  Lab Results  Component Value Date   LDLCALC 126 (H) 12/16/2020    Lab Results  Component Value Date   HGBA1C 8.3 (A) 12/16/2020     Review of Systems  Constitutional:  Negative for fever, malaise/fatigue and weight loss.  HENT: Negative.  Negative for nosebleeds.   Eyes: Negative.  Negative for blurred vision, double vision and photophobia.  Respiratory: Negative.  Negative for cough and shortness of breath.   Cardiovascular: Negative.  Negative for chest pain, palpitations and leg swelling.  Gastrointestinal: Negative.  Negative for heartburn, nausea and vomiting.  Genitourinary: Negative.   Musculoskeletal: Negative.  Negative for myalgias.  Skin:  Negative.   Neurological: Negative.  Negative for dizziness, focal weakness, seizures and headaches.  Endo/Heme/Allergies: Negative.   Psychiatric/Behavioral: Negative.  Negative for suicidal ideas.     Past Medical History:  Diagnosis Date   DM (diabetes mellitus), type 2 with complications ([place])    Hyperlipidemia    Tobacco dependence     History reviewed. No pertinent surgical history.  Family History  Problem Relation Age of Onset   Diabetes Mother    Hypertension Mother    Alcohol abuse Father    Diabetes Sister    Cancer Maternal Aunt    Diabetes Paternal Aunt    Alcohol abuse Paternal Uncle    Diabetes Paternal Grandmother     Social History Reviewed with no changes to be made today.   Outpatient Medications Prior to Visit  Medication Sig Dispense Refill   aspirin EC 81 MG tablet Take 1 tablet (81 mg total) by mouth daily. 30 tablet 2   Blood Glucose Monitoring Suppl (TRUE METRIX METER) w/Device KIT Check blood sugar fasting in the morning and at bedtime 1 kit 0   esomeprazole (NEXIUM) 20 MG capsule Take 1 capsule (20 mg total) by mouth daily. 90 capsule 2   glipiZIDE (GLUCOTROL) 5 MG tablet TAKE 1 TABLET (5 MG TOTAL) BY MOUTH 2 (TWO) TIMES DAILY BEFORE A MEAL. 180 tablet 1   glucose blood (TRUE METRIX BLOOD GLUCOSE TEST) test strip Use as instructed 100 each 12   sildenafil (VIAGRA) 100 MG tablet Take 1 tablet (100 mg total) by mouth daily as needed for erectile dysfunction. 10 tablet 6   TRUEplus Lancets 28G MISC Check blood sugar fasting and at bedtime 100 each 2   atorvastatin (LIPITOR) 20 MG tablet Take 1 tablet (20 mg total) by mouth daily. 90 tablet 1   atorvastatin (LIPITOR) 20 MG tablet TAKE 1 TABLET (20 MG TOTAL) BY MOUTH DAILY. 90 tablet 1   glipiZIDE (GLUCOTROL) 5 MG tablet Take 1 tablet (5 mg total) by mouth 2 (two) times daily before a meal. 180 tablet 1   metFORMIN (GLUCOPHAGE-XR) 500 MG 24 hr tablet Take 1 tablet (500 mg total) by mouth 2 (two) times  daily. 180 tablet 1   metFORMIN (GLUCOPHAGE-XR) 500 MG 24 hr tablet TAKE 1 TABLET (500 MG TOTAL) BY MOUTH 2 (TWO) TIMES DAILY. 180 tablet 1   sildenafil (VIAGRA) 100 MG tablet TAKE 1 TABLET (100 MG TOTAL) BY MOUTH DAILY AS NEEDED FOR ERECTILE DYSFUNCTION. 10 tablet 6   No facility-administered medications prior to visit.    No Known Allergies     Objective:    BP 137/73   Pulse 71   Ht '5\' 7"'  (1.702 m)   Wt 120 lb 6.4 oz (54.6 kg)   SpO2 99%   BMI 18.86 kg/m  Wt Readings from Last 3 Encounters:  06/30/22 120  lb 6.4 oz (54.6 kg)  12/16/20 124 lb (56.2 kg)  01/03/19 125 lb 3.2 oz (56.8 kg)    Physical Exam Constitutional:      Appearance: He is well-developed.  HENT:     Head: Normocephalic and atraumatic.     Right Ear: Hearing, tympanic membrane, ear canal and external ear normal.     Left Ear: Hearing, tympanic membrane, ear canal and external ear normal.     Nose: Nose normal. No mucosal edema or rhinorrhea.     Right Turbinates: Not enlarged.     Left Turbinates: Not enlarged.     Mouth/Throat:     Lips: Pink.     Mouth: Mucous membranes are moist.     Dentition: No gingival swelling, dental abscesses or gum lesions.     Pharynx: Uvula midline.     Tonsils: No tonsillar exudate. 1+ on the right. 1+ on the left.  Eyes:     General: Lids are normal. No scleral icterus.    Extraocular Movements: Extraocular movements intact.     Conjunctiva/sclera: Conjunctivae normal.     Pupils: Pupils are equal, round, and reactive to light.  Neck:     Thyroid: No thyromegaly.     Trachea: No tracheal deviation.  Cardiovascular:     Rate and Rhythm: Normal rate and regular rhythm.     Heart sounds: Normal heart sounds. No murmur heard.    No friction rub. No gallop.  Pulmonary:     Effort: Pulmonary effort is normal. No respiratory distress.     Breath sounds: Normal breath sounds. No wheezing or rales.  Chest:     Chest wall: No mass or tenderness.  Breasts:    Right: No  inverted nipple, mass, nipple discharge, skin change or tenderness.     Left: No inverted nipple, mass, nipple discharge, skin change or tenderness.  Abdominal:     General: Bowel sounds are normal. There is no distension.     Palpations: Abdomen is soft. There is no mass.     Tenderness: There is no abdominal tenderness. There is no guarding or rebound.  Musculoskeletal:        General: No tenderness or deformity. Normal range of motion.     Cervical back: Normal range of motion and neck supple.  Lymphadenopathy:     Cervical: No cervical adenopathy.  Skin:    General: Skin is warm and dry.     Capillary Refill: Capillary refill takes less than 2 seconds.     Findings: No erythema.  Neurological:     Mental Status: He is alert and oriented to person, place, and time.     Cranial Nerves: No cranial nerve deficit.     Sensory: Sensation is intact.     Motor: No abnormal muscle tone.     Coordination: Coordination is intact. Coordination normal.     Gait: Gait is intact.     Deep Tendon Reflexes: Reflexes normal.     Reflex Scores:      Patellar reflexes are 1+ on the right side and 1+ on the left side. Psychiatric:        Attention and Perception: Attention normal.        Mood and Affect: Mood normal.        Speech: Speech normal.        Behavior: Behavior normal.        Thought Content: Thought content normal.        Judgment: Judgment normal.  Patient has been counseled extensively about nutrition and exercise as well as the importance of adherence with medications and regular follow-up. The patient was given clear instructions to go to ER or return to medical center if symptoms don't improve, worsen or new problems develop. The patient verbalized understanding.   Follow-up: Return in about 3 months (around 09/29/2022).   Gildardo Pounds, FNP-BC [facility] and [place], [place]   06/30/2022, 4:20 PM

## 2022-07-01 LAB — LIPID PANEL
Chol/HDL Ratio: 3.6 ratio (ref 0.0–5.0)
Cholesterol, Total: 208 mg/dL — ABNORMAL HIGH (ref 100–199)
HDL: 58 mg/dL (ref >39)
LDL Chol Calc (NIH): 135 mg/dL — ABNORMAL HIGH (ref 0–99)
Triglycerides: 85 mg/dL (ref 0–149)
VLDL Cholesterol Cal: 15 mg/dL (ref 5–40)

## 2022-07-01 LAB — CBC WITH DIFFERENTIAL/PLATELET
Basophils Absolute: 0.1 10*3/uL (ref 0.0–0.2)
Basos: 1 %
EOS (ABSOLUTE): 0.2 10*3/uL (ref 0.0–0.4)
Eos: 3 %
Hematocrit: 39.3 % (ref 37.5–51.0)
Hemoglobin: 13.3 g/dL (ref 13.0–17.7)
Immature Grans (Abs): 0 10*3/uL (ref 0.0–0.1)
Immature Granulocytes: 0 %
Lymphocytes Absolute: 2.3 10*3/uL (ref 0.7–3.1)
Lymphs: 30 %
MCH: 30.9 pg (ref 26.6–33.0)
MCHC: 33.8 g/dL (ref 31.5–35.7)
MCV: 91 fL (ref 79–97)
Monocytes Absolute: 0.4 10*3/uL (ref 0.1–0.9)
Monocytes: 6 %
Neutrophils Absolute: 4.7 10*3/uL (ref 1.4–7.0)
Neutrophils: 60 %
Platelets: 319 10*3/uL (ref 150–450)
RBC: 4.3 x10E6/uL (ref 4.14–5.80)
RDW: 11.8 % (ref 11.6–15.4)
WBC: 7.8 10*3/uL (ref 3.4–10.8)

## 2022-07-01 LAB — CMP14+EGFR
ALT: 10 IU/L (ref 0–44)
AST: 13 IU/L (ref 0–40)
Albumin/Globulin Ratio: 2.3 — ABNORMAL HIGH (ref 1.2–2.2)
Albumin: 4.9 g/dL (ref 3.8–4.9)
Alkaline Phosphatase: 85 IU/L (ref 44–121)
BUN/Creatinine Ratio: 9 (ref 9–20)
BUN: 8 mg/dL (ref 6–24)
Bilirubin Total: 0.4 mg/dL (ref 0.0–1.2)
CO2: 26 mmol/L (ref 20–29)
Calcium: 9.8 mg/dL (ref 8.7–10.2)
Chloride: 99 mmol/L (ref 96–106)
Creatinine, Ser: 0.93 mg/dL (ref 0.76–1.27)
Globulin, Total: 2.1 g/dL (ref 1.5–4.5)
Glucose: 253 mg/dL — ABNORMAL HIGH (ref 70–99)
Potassium: 4.6 mmol/L (ref 3.5–5.2)
Sodium: 139 mmol/L (ref 134–144)
Total Protein: 7 g/dL (ref 6.0–8.5)
eGFR: 95 mL/min/{1.73_m2} (ref >59)

## 2022-07-01 LAB — MICROALBUMIN / CREATININE URINE RATIO
Creatinine, Urine: 97.4 mg/dL
Microalb/Creat Ratio: 10 mg/g creat (ref 0–29)
Microalbumin, Urine: 9.4 ug/mL

## 2022-07-01 LAB — HEMOGLOBIN A1C
Est. average glucose Bld gHb Est-mCnc: 286 mg/dL
Hgb A1c MFr Bld: 11.6 % — ABNORMAL HIGH (ref 4.8–5.6)

## 2022-07-01 LAB — HIV ANTIBODY (ROUTINE TESTING W REFLEX): HIV Screen 4th Generation wRfx: NONREACTIVE

## 2022-07-01 LAB — RPR: RPR Ser Ql: NONREACTIVE

## 2022-07-02 ENCOUNTER — Other Ambulatory Visit: Payer: Self-pay | Admitting: Nurse Practitioner

## 2022-07-02 ENCOUNTER — Other Ambulatory Visit: Payer: Self-pay

## 2022-07-02 DIAGNOSIS — E1165 Type 2 diabetes mellitus with hyperglycemia: Secondary | ICD-10-CM

## 2022-07-02 MED ORDER — GLIPIZIDE 10 MG PO TABS
5.0000 mg | ORAL_TABLET | Freq: Two times a day (BID) | ORAL | 3 refills | Status: AC
  Filled 2022-07-02: qty 30, 30d supply, fill #0
  Filled 2022-12-19: qty 60, 60d supply, fill #0
  Filled 2022-12-21: qty 30, 30d supply, fill #0

## 2022-07-03 ENCOUNTER — Other Ambulatory Visit: Payer: Self-pay

## 2022-07-06 LAB — URINE CYTOLOGY ANCILLARY ONLY
Bacterial Vaginitis-Urine: NEGATIVE
Candida Urine: NEGATIVE
Chlamydia: NEGATIVE
Comment: NEGATIVE
Comment: NEGATIVE
Comment: NORMAL
Neisseria Gonorrhea: NEGATIVE
Trichomonas: NEGATIVE

## 2022-07-09 ENCOUNTER — Other Ambulatory Visit: Payer: Self-pay

## 2022-09-29 ENCOUNTER — Ambulatory Visit: Payer: Self-pay | Admitting: Nurse Practitioner

## 2022-12-18 ENCOUNTER — Emergency Department (HOSPITAL_COMMUNITY): Payer: Self-pay

## 2022-12-18 ENCOUNTER — Other Ambulatory Visit: Payer: Self-pay

## 2022-12-18 ENCOUNTER — Emergency Department (HOSPITAL_COMMUNITY)
Admission: EM | Admit: 2022-12-18 | Discharge: 2022-12-19 | Disposition: A | Discharge status: Home / Self Care | Payer: Self-pay | Attending: Emergency Medicine | Admitting: Emergency Medicine

## 2022-12-18 DIAGNOSIS — R052 Subacute cough: Secondary | ICD-10-CM | POA: Insufficient documentation

## 2022-12-18 DIAGNOSIS — R062 Wheezing: Secondary | ICD-10-CM | POA: Insufficient documentation

## 2022-12-18 DIAGNOSIS — R6883 Chills (without fever): Secondary | ICD-10-CM | POA: Insufficient documentation

## 2022-12-18 DIAGNOSIS — Z7984 Long term (current) use of oral hypoglycemic drugs: Secondary | ICD-10-CM | POA: Insufficient documentation

## 2022-12-18 DIAGNOSIS — R0981 Nasal congestion: Secondary | ICD-10-CM | POA: Insufficient documentation

## 2022-12-18 DIAGNOSIS — Z7982 Long term (current) use of aspirin: Secondary | ICD-10-CM | POA: Insufficient documentation

## 2022-12-18 DIAGNOSIS — Z79899 Other long term (current) drug therapy: Secondary | ICD-10-CM | POA: Insufficient documentation

## 2022-12-18 DIAGNOSIS — Z1152 Encounter for screening for COVID-19: Secondary | ICD-10-CM | POA: Insufficient documentation

## 2022-12-18 DIAGNOSIS — Z87891 Personal history of nicotine dependence: Secondary | ICD-10-CM | POA: Insufficient documentation

## 2022-12-18 NOTE — ED Triage Notes (Signed)
Pt has had cough for 2 weeks with congestion..  Pt has tried everything OTC at home but nothing worked.

## 2022-12-19 ENCOUNTER — Telehealth: Payer: Self-pay

## 2022-12-19 LAB — RESP PANEL BY RT-PCR (RSV, FLU A&B, COVID)  RVPGX2
Influenza A by PCR: NEGATIVE
Influenza B by PCR: NEGATIVE
Resp Syncytial Virus by PCR: NEGATIVE
SARS Coronavirus 2 by RT PCR: NEGATIVE

## 2022-12-19 MED ORDER — PREDNISONE 20 MG PO TABS
60.0000 mg | ORAL_TABLET | Freq: Once | ORAL | Status: AC
  Administered 2022-12-19: 60 mg via ORAL
  Filled 2022-12-19: qty 3

## 2022-12-19 MED ORDER — AEROCHAMBER PLUS FLO-VU LARGE MISC: 1.0000 | Freq: Once | Status: DC

## 2022-12-19 MED ORDER — DOXYCYCLINE HYCLATE 100 MG PO TABS
100.0000 mg | ORAL_TABLET | Freq: Once | ORAL | Status: AC
  Administered 2022-12-19: 100 mg via ORAL
  Filled 2022-12-19: qty 1

## 2022-12-19 MED ORDER — BENZONATATE 100 MG PO CAPS
100.0000 mg | ORAL_CAPSULE | Freq: Once | ORAL | Status: AC
  Administered 2022-12-19: 100 mg via ORAL
  Filled 2022-12-19: qty 1

## 2022-12-19 MED ORDER — DOXYCYCLINE HYCLATE 100 MG PO CAPS: 100.0000 mg | ORAL_CAPSULE | Freq: Two times a day (BID) | ORAL | 0 refills | Status: AC

## 2022-12-19 MED ORDER — ALBUTEROL SULFATE HFA 108 (90 BASE) MCG/ACT IN AERS
4.0000 | INHALATION_SPRAY | Freq: Once | RESPIRATORY_TRACT | Status: AC
  Administered 2022-12-19: 4 via RESPIRATORY_TRACT
  Filled 2022-12-19: qty 6.7

## 2022-12-19 MED ORDER — PREDNISONE 20 MG PO TABS: ORAL_TABLET | ORAL | 0 refills | Status: AC

## 2022-12-19 MED ORDER — BENZONATATE 100 MG PO CAPS: 100.0000 mg | ORAL_CAPSULE | Freq: Three times a day (TID) | ORAL | 0 refills | Status: AC | PRN

## 2022-12-19 NOTE — ED Provider Notes (Signed)
[facility] Provider Note   CSN: [id number] Arrival date & time: 12/18/22  2253     History  Chief Complaint  Patient presents with   Cough    Gregory Schaefer is a 62 y.o. male.  62 year old male who presents ER today secondary to cough.  Patient states he is felt unwell for about 3 weeks.  Started with an upper respiratory symptoms.  He had some congestion and swelling underneath his eyes progressively worsened and then started having cough that has not improved.  Has had some fevers but unmeasured temperatures.  Some chills as well.  States Tylenol seems to help them.  He states that he coughs up some yellowish [place].  Has an extensive smoking history about half a pack a day for many years.  No diagnosis of COPD or asthma.  No chest pain.  No lower extremity swelling.  No cardiac history.   Cough      Home Medications Prior to Admission medications   Medication Sig Start Date End Date Taking? Authorizing Provider  benzonatate (TESSALON) 100 MG capsule Take 1 capsule (100 mg total) by mouth 3 (three) times daily as needed for cough. 12/19/22  Yes Massa Pe, Corene Cornea, MD  doxycycline (VIBRAMYCIN) 100 MG capsule Take 1 capsule (100 mg total) by mouth 2 (two) times daily. One po bid x 7 days 12/19/22  Yes Mavryk Pino, Corene Cornea, MD  predniSONE (DELTASONE) 20 MG tablet 2 tabs po daily x 4 days 12/19/22  Yes Viraj Liby, Corene Cornea, MD  aspirin EC 81 MG tablet Take 1 tablet (81 mg total) by mouth daily. 12/23/16   Alfonse Spruce, FNP  atorvastatin (LIPITOR) 20 MG tablet Take 1 tablet (20 mg total) by mouth daily. 06/30/22   Gildardo Pounds, NP  Blood Glucose Monitoring Suppl (TRUE METRIX METER) w/Device KIT Check blood sugar fasting in the morning and at bedtime 12/16/20   Gildardo Pounds, NP  esomeprazole (NEXIUM) 20 MG capsule Take 1 capsule (20 mg total) by mouth daily. 06/30/22   Gildardo Pounds, NP  glipiZIDE (GLUCOTROL) 10 MG tablet Take 0.5 tablets (5 mg total)  by mouth 2 (two) times daily before a meal. 07/02/22   Gildardo Pounds, NP  glucose blood (TRUE METRIX BLOOD GLUCOSE TEST) test strip Use as instructed 06/30/22   Gildardo Pounds, NP  sildenafil (VIAGRA) 100 MG tablet Take 1 tablet (100 mg total) by mouth daily as needed for erectile dysfunction. 06/30/22   Gildardo Pounds, NP  TRUEplus Lancets 28G MISC Check blood sugar fasting and at bedtime 06/30/22   Gildardo Pounds, NP      Allergies    Patient has no known allergies.    Review of Systems   Review of Systems  Respiratory:  Positive for cough.     Physical Exam Updated Vital Signs BP 126/88   Pulse 93   Temp 98.5 F (36.9 C) (Oral)   Resp 17   Ht '5\' 7"'$  (1.702 m)   Wt 58.1 kg   SpO2 99%   BMI 20.05 kg/m  Physical Exam Vitals and nursing note reviewed.  Constitutional:      Appearance: He is well-developed.  HENT:     Head: Normocephalic and atraumatic.     Mouth/Throat:     Mouth: Mucous membranes are moist.     Pharynx: No oropharyngeal exudate or posterior oropharyngeal erythema.  Eyes:     Pupils: Pupils are equal, round, and reactive to light.  Cardiovascular:     Rate and Rhythm: Normal rate.  Pulmonary:     Effort: Pulmonary effort is normal. No respiratory distress.     Breath sounds: Decreased air movement present. Wheezing present.  Abdominal:     General: Abdomen is flat. There is no distension.  Musculoskeletal:        General: No swelling or tenderness. Normal range of motion.     Cervical back: Normal range of motion.  Skin:    General: Skin is warm and dry.  Neurological:     General: No focal deficit present.     Mental Status: He is alert.     ED Results / Procedures / Treatments   Labs (all labs ordered are listed, but only abnormal results are displayed) Labs Reviewed  RESP PANEL BY RT-PCR (RSV, FLU A&B, COVID)  [id number]    EKG None  Radiology DG Chest 2 View  Result Date: 12/18/2022 CLINICAL DATA:  Cough. EXAM: CHEST - 2 VIEW  COMPARISON:  June 24, 2017 FINDINGS: The heart size and mediastinal contours are within normal limits. Both lungs are clear. The visualized skeletal structures are unremarkable. IMPRESSION: No active cardiopulmonary disease. Electronically Signed   By: Virgina Norfolk M.D.   On: 12/18/2022 23:29    Procedures Procedures    Medications Ordered in ED Medications  AeroChamber Plus Flo-Vu Large MISC 1 each (has no administration in time range)  predniSONE (DELTASONE) tablet 60 mg (has no administration in time range)  benzonatate (TESSALON) capsule 100 mg (100 mg Oral Given 12/19/22 0351)  albuterol (VENTOLIN HFA) 108 (90 Base) MCG/ACT inhaler 4 puff (4 puffs Inhalation Given 12/19/22 0351)  doxycycline (VIBRA-TABS) tablet 100 mg (100 mg Oral Given 12/19/22 0351)    ED Course/ Medical Decision Making/ A&P                             Medical Decision Making Amount and/or Complexity of Data Reviewed Radiology: ordered.  Risk Prescription drug management.   Likely bronchitis/commune acquired pneumonia.  He has been having symptoms for 3 weeks now so we will treat for bacterial causes.  Will try some albuterol to see if helps with his coughing and decreased breath sounds if so we will also initiate some prednisone.  No focal findings on lung exam or chest x-ray to suggest a consolidative typical pneumonia.  Not hypoxic, tachypneic or tachycardic to suggest sepsis or systemic symptoms related to his infection.  Suspect patient would likely be discharged but will need repeat evaluation for discharge medications.  Significant improvement with albuterol we will go ahead and initiate prednisone as well for likely bronchoconstriction/bronchitis or early emphysema undiagnosed COPD.  Outpatient prescription for antibiotics and prednisone given.  Also as needed Tessalon.  PCP follow-up if not improving in 5 to 7 days   CXR done and showed no focal abnormalities (independently viewed and interpreted by  myself and radiology read reviewed).   Final Clinical Impression(s) / ED Diagnoses Final diagnoses:  Subacute cough    Rx / DC Orders ED Discharge Orders          Ordered    predniSONE (DELTASONE) 20 MG tablet        12/19/22 0454    doxycycline (VIBRAMYCIN) 100 MG capsule  2 times daily        12/19/22 0454    benzonatate (TESSALON) 100 MG capsule  3 times daily PRN  12/19/22 0455              Erasto Sleight, Corene Cornea, MD 12/19/22 [phone]

## 2022-12-19 NOTE — Telephone Encounter (Signed)
Patient states he does not have the prescriptions from his visit.  Requests them called in to [facility] on [address]. Called in as written 3 prescriptions.

## 2022-12-21 ENCOUNTER — Other Ambulatory Visit: Payer: Self-pay

## 2022-12-25 ENCOUNTER — Other Ambulatory Visit: Payer: Self-pay

## 2023-01-01 ENCOUNTER — Other Ambulatory Visit: Payer: Self-pay
